# Patient Record
Sex: Male | Born: 1980
Health system: Southern US, Community
[De-identification: ages and names within clinical notes are randomized; demographics above are authoritative.]

## PROBLEM LIST (undated history)

## (undated) ENCOUNTER — Emergency Department (HOSPITAL_BASED_OUTPATIENT_CLINIC_OR_DEPARTMENT_OTHER): Admission: EM | Payer: Commercial Managed Care - HMO | Source: Home / Self Care

## (undated) DIAGNOSIS — J309 Allergic rhinitis, unspecified: Secondary | ICD-10-CM

## (undated) HISTORY — DX: Allergic rhinitis, unspecified: J30.9

---

## 2012-07-04 ENCOUNTER — Ambulatory Visit: Payer: Medicaid Other | Attending: Family Medicine | Admitting: Family Medicine

## 2012-07-04 VITALS — BP 126/86 | HR 62 | Temp 97.8°F | Resp 18 | Ht 68.0 in | Wt 175.0 lb

## 2012-07-04 DIAGNOSIS — R0683 Snoring: Secondary | ICD-10-CM

## 2012-07-04 DIAGNOSIS — R0609 Other forms of dyspnea: Secondary | ICD-10-CM

## 2012-07-04 DIAGNOSIS — J3489 Other specified disorders of nose and nasal sinuses: Secondary | ICD-10-CM

## 2012-07-04 DIAGNOSIS — J309 Allergic rhinitis, unspecified: Secondary | ICD-10-CM | POA: Insufficient documentation

## 2012-07-04 HISTORY — DX: Allergic rhinitis, unspecified: J30.9

## 2012-07-04 MED ORDER — LORATADINE 10 MG PO TABS: 10.0000 mg | ORAL_TABLET | Freq: Every day | ORAL | Status: DC

## 2012-07-04 MED ORDER — FLUTICASONE PROPIONATE 50 MCG/ACT NA SUSP: 2.0000 | Freq: Every day | NASAL | Status: DC

## 2012-07-04 NOTE — Progress Notes (Signed)
Patient ID: Derek Luna, male   DOB: 1980/12/17, 32 y.o.   MRN: [id number] CC: night time nasal congestion Translator used to speak with patient HPI: Pt is a new patient to the clinic and reports that he has a history of nighttime nasal congestion that is bothersome especially at night.  He says that he has been snoring and mouth breathing at night because of his nasal congestion.  He has occasional sneezing and watery eyes.  He reports that he otherwise has been healthy.   No Known Allergies Past Medical History  Diagnosis Date  . Allergic rhinitis 07/04/2012   No current outpatient prescriptions on file prior to visit.   No current facility-administered medications on file prior to visit.   History reviewed. No pertinent family history. History   Social History  . Marital Status: Married    Spouse Name: N/A    Number of Children: N/A  . Years of Education: N/A   Occupational History  . Not on file.   Social History Main Topics  . Smoking status: Never Smoker   . Smokeless tobacco: Not on file  . Alcohol Use: No  . Drug Use: No  . Sexually Active: Yes   Other Topics Concern  . Not on file   Social History Narrative  . No narrative on file    Review of Systems  Constitutional: Negative for fever, chills, diaphoresis, activity change, appetite change and fatigue.  HENT: Negative for ear pain, nosebleeds, positive nasal congestion, facial swelling, rhinorrhea, neck pain, neck stiffness and ear discharge.   Eyes: Negative for pain, discharge, redness, positive for occasional itching and no visual disturbance.  Respiratory: Negative for cough, choking, chest tightness, shortness of breath, wheezing and stridor.  Cardiovascular: Negative for chest pain, palpitations and leg swelling.  Gastrointestinal: Negative for abdominal distention.  Genitourinary: Negative for dysuria, urgency, frequency, hematuria, flank pain, decreased urine volume, difficulty urinating and  dyspareunia.  Musculoskeletal: Negative for back pain, joint swelling, arthralgias and gait problem.  Neurological: Negative for dizziness, tremors, seizures, syncope, facial asymmetry, speech difficulty, weakness, light-headedness, numbness and headaches.  Hematological: Negative for adenopathy. Does not bruise/bleed easily.  Psychiatric/Behavioral: Negative for hallucinations, behavioral problems, confusion, dysphoric mood, decreased concentration and agitation.    Objective:   Filed Vitals:   07/04/12 1028  BP: 126/86  Pulse: 62  Temp: 97.8 F (36.6 C)  Resp: 18    Physical Exam  Constitutional: Appears well-developed and well-nourished. No distress.  HENT: Normocephalic. External right and left ear normal. Oropharynx is clear and moist. nares reveals swollen nasal turbinates Eyes: Conjunctivae and EOM are normal. PERRLA, no scleral icterus.  Neck: Normal ROM. Neck supple. No JVD. No tracheal deviation. No thyromegaly.  CVS: RRR, S1/S2 +, no murmurs, no gallops, no carotid bruit.  Pulmonary: Effort and breath sounds normal, no stridor, rhonchi, wheezes, rales.  Abdominal: Soft. BS +,  no distension, tenderness, rebound or guarding.  Musculoskeletal: Normal range of motion. No edema and no tenderness.  Lymphadenopathy: No lymphadenopathy noted, cervical, inguinal. Neuro: Alert. Normal reflexes, muscle tone coordination. No cranial nerve deficit. Skin: Skin is warm and dry. No rash noted. Not diaphoretic. No erythema. No pallor.  Psychiatric: Normal mood and affect. Behavior, judgment, thought content normal.   No results found for this basename: WBC, HGB, HCT, MCV, PLT   No results found for this basename: CREATININE, BUN, NA, K, CL, CO2    No results found for this basename: HGBA1C     Assessment:   Patient  Active Problem List   Diagnosis Date Noted  . Allergic rhinitis 07/04/2012          Plan:  flonase NS take 2 spray per nostril once daily claritin 10 mg po daily   RTC if this doesn't work well to clear up symptoms  The patient was given clear instructions to go to ER or return to medical center if symptoms don't improve, worsen or new problems develop.  The patient verbalized understanding.  The patient was told to call to get lab results if they haven't heard anything in the next week.    Rodney Langton, MD, CDE, FAAFP [facility] [place], [place]

## 2012-07-04 NOTE — Patient Instructions (Signed)
Allergic Rhinitis  Allergic rhinitis is when the mucous membranes in the nose respond to allergens. Allergens are particles in the air that cause your body to have an allergic reaction. This causes you to release allergic antibodies. Through a chain of events, these eventually cause you to release histamine into the blood stream (hence the use of antihistamines). Although meant to be protective to the body, it is this release that causes your discomfort, such as frequent sneezing, congestion and an itchy runny nose.    CAUSES    The pollen allergens may come from grasses, trees, and weeds. This is seasonal allergic rhinitis, or "hay fever." Other allergens cause year-round allergic rhinitis (perennial allergic rhinitis) such as house dust mite allergen, pet dander and mold spores.    SYMPTOMS     Nasal stuffiness (congestion).   Runny, itchy nose with sneezing and tearing of the eyes.   There is often an itching of the mouth, eyes and ears.  It cannot be cured, but it can be controlled with medications.  DIAGNOSIS    If you are unable to determine the offending allergen, skin or blood testing may find it.  TREATMENT     Avoid the allergen.   Medications and allergy shots (immunotherapy) can help.   Hay fever may often be treated with antihistamines in pill or nasal spray forms. Antihistamines block the effects of histamine. There are over-the-counter medicines that may help with nasal congestion and swelling around the eyes. Check with your caregiver before taking or giving this medicine.  If the treatment above does not work, there are many new medications your caregiver can prescribe. Stronger medications may be used if initial measures are ineffective. Desensitizing injections can be used if medications and avoidance fails. Desensitization is when a patient is given ongoing shots until the body becomes less sensitive to the allergen. Make sure you follow up with your caregiver if problems continue.   SEEK MEDICAL CARE IF:     You develop fever (more than 100.5 F (38.1 C).   You develop a cough that does not stop easily (persistent).   You have shortness of breath.   You start wheezing.   Symptoms interfere with normal daily activities.  Document Released: 10/31/2000 Document Revised: 04/30/2011 Document Reviewed: 05/12/2008  ExitCare Patient Information 2013 [employer].

## 2012-07-04 NOTE — Progress Notes (Signed)
Patient states him and his wife have trouble Breathing through their nose at night

## 2012-07-05 ENCOUNTER — Encounter: Payer: Self-pay | Admitting: Family Medicine

## 2012-07-05 DIAGNOSIS — R0981 Nasal congestion: Secondary | ICD-10-CM | POA: Insufficient documentation

## 2012-07-05 DIAGNOSIS — R0683 Snoring: Secondary | ICD-10-CM | POA: Insufficient documentation

## 2012-08-26 ENCOUNTER — Encounter (HOSPITAL_COMMUNITY): Payer: Self-pay | Admitting: Emergency Medicine

## 2012-08-26 ENCOUNTER — Emergency Department (HOSPITAL_COMMUNITY)
Admission: EM | Admit: 2012-08-26 | Discharge: 2012-08-26 | Disposition: A | Discharge status: Home / Self Care | Payer: Medicaid Other | Source: Home / Self Care

## 2012-08-26 DIAGNOSIS — K5900 Constipation, unspecified: Secondary | ICD-10-CM

## 2012-08-26 DIAGNOSIS — R1033 Periumbilical pain: Secondary | ICD-10-CM

## 2012-08-26 MED ORDER — POLYETHYLENE GLYCOL 3350 17 GM/SCOOP PO POWD: 17.0000 g | Freq: Every day | ORAL | Status: DC

## 2012-08-26 NOTE — ED Notes (Signed)
C/o stomach pain X 2 days, Pt states pain is constant and stomach feels tight.  Pt denies N/V and fever.  Pt states he has had some cramping.  Pt is alert and in no acute distress.  Leilani Able CMA Student

## 2012-08-26 NOTE — ED Provider Notes (Signed)
   History    CSN: [id number] Arrival date & time 08/26/12  1515  None    Chief Complaint  Patient presents with  . Abdominal Pain   (Consider location/radiation/quality/duration/timing/severity/associated sxs/prior Treatment) HPI Comments: 32 year old [place] man complaining of 2 and a half days of intermittent periumbilical pain. States the pain is mild and rarely moderate. He denies trauma or taking the medicine. He does state that he has a recent change in diet and that is a practicing Rhmadan in which his diet/food intake is drastically reduced. He states he has had normal bowel movements the last one was last night.  Past Medical History  Diagnosis Date  . Allergic rhinitis 07/04/2012   History reviewed. No pertinent past surgical history. No family history on file. History  Substance Use Topics  . Smoking status: Never Smoker   . Smokeless tobacco: Not on file  . Alcohol Use: No    Review of Systems  Constitutional: Negative.   HENT: Negative.   Respiratory: Negative.   Gastrointestinal: Positive for abdominal pain. Negative for nausea, vomiting, diarrhea, blood in stool, abdominal distention, anal bleeding and rectal pain.  Genitourinary: Negative.   Musculoskeletal: Negative.   Skin: Negative.   Neurological: Negative.   Hematological: Negative.     Allergies  Review of patient's allergies indicates no known allergies.  Home Medications   Current Outpatient Rx  Name  Route  Sig  Dispense  Refill  . fluticasone (FLONASE) 50 MCG/ACT nasal spray   Nasal   Place 2 sprays into the nose daily.   16 g   6   . loratadine (CLARITIN) 10 MG tablet   Oral   Take 1 tablet (10 mg total) by mouth daily.   30 tablet   11   . polyethylene glycol powder (GLYCOLAX/MIRALAX) powder   Oral   Take 17 g by mouth daily.   255 g   0    BP 130/88  Pulse 71  Temp(Src) 98.2 F (36.8 C) (Oral)  Resp 17  SpO2 100% Physical Exam  Nursing note and vitals  reviewed. Constitutional: He is oriented to person, place, and time. He appears well-developed and well-nourished. No distress.  Eyes: Conjunctivae and EOM are normal.  Neck: Normal range of motion.  Cardiovascular: Normal rate, regular rhythm and normal heart sounds.   Pulmonary/Chest: Breath sounds normal. No respiratory distress. He has no wheezes.  Abdominal: Soft. Bowel sounds are normal. He exhibits no distension. There is no rebound and no guarding.  Minor tenderness in the. Buccal abdomen. No tenderness in the right or left upper or lower quadrants. Data to percusses dull over the areas of the large colon and there are no areas that percuss tympanic.  Musculoskeletal: He exhibits no edema.  Neurological: He is alert and oriented to person, place, and time. He exhibits normal muscle tone.  Skin: Skin is warm and dry.  Psychiatric: He has a normal mood and affect.    ED Course  Procedures (including critical care time) Labs Reviewed - No data to display No results found. 1. Periumbilical pain   2. Constipation     MDM  I suspect, since the patient has had a change in diet during [place] he has developed constipation. Abdominal exam benign . Otherwise asymptomatic and appears healthy.  Miralax as directed with plenty of water.   Hayden Rasmussen, NP 08/26/12 1553  Hayden Rasmussen, NP 08/26/12 1556

## 2012-08-27 NOTE — ED Provider Notes (Signed)
Medical screening examination/treatment/procedure(s) were performed by resident physician or non-physician practitioner and as supervising physician I was immediately available for consultation/collaboration.   KINDL,JAMES DOUGLAS MD.   James D Kindl, MD 08/27/12 2054 

## 2014-02-07 ENCOUNTER — Emergency Department (HOSPITAL_COMMUNITY)
Admission: EM | Admit: 2014-02-07 | Discharge: 2014-02-07 | Disposition: A | Discharge status: Home / Self Care | Payer: No Typology Code available for payment source | Attending: Emergency Medicine | Admitting: Emergency Medicine

## 2014-02-07 ENCOUNTER — Encounter (HOSPITAL_COMMUNITY): Payer: Self-pay | Admitting: Emergency Medicine

## 2014-02-07 DIAGNOSIS — S0990XA Unspecified injury of head, initial encounter: Secondary | ICD-10-CM | POA: Diagnosis not present

## 2014-02-07 DIAGNOSIS — Z79899 Other long term (current) drug therapy: Secondary | ICD-10-CM | POA: Diagnosis not present

## 2014-02-07 DIAGNOSIS — Z7951 Long term (current) use of inhaled steroids: Secondary | ICD-10-CM | POA: Diagnosis not present

## 2014-02-07 DIAGNOSIS — Y9389 Activity, other specified: Secondary | ICD-10-CM | POA: Diagnosis not present

## 2014-02-07 DIAGNOSIS — Y998 Other external cause status: Secondary | ICD-10-CM | POA: Diagnosis not present

## 2014-02-07 DIAGNOSIS — S24109A Unspecified injury at unspecified level of thoracic spinal cord, initial encounter: Secondary | ICD-10-CM | POA: Diagnosis not present

## 2014-02-07 DIAGNOSIS — R51 Headache: Secondary | ICD-10-CM

## 2014-02-07 DIAGNOSIS — Y92410 Unspecified street and highway as the place of occurrence of the external cause: Secondary | ICD-10-CM | POA: Insufficient documentation

## 2014-02-07 DIAGNOSIS — Z8709 Personal history of other diseases of the respiratory system: Secondary | ICD-10-CM | POA: Insufficient documentation

## 2014-02-07 DIAGNOSIS — R519 Headache, unspecified: Secondary | ICD-10-CM

## 2014-02-07 DIAGNOSIS — S8992XA Unspecified injury of left lower leg, initial encounter: Secondary | ICD-10-CM | POA: Diagnosis not present

## 2014-02-07 MED ORDER — IBUPROFEN 800 MG PO TABS: 800.0000 mg | ORAL_TABLET | Freq: Three times a day (TID) | ORAL | Status: DC | PRN

## 2014-02-07 MED ORDER — CYCLOBENZAPRINE HCL 10 MG PO TABS: 10.0000 mg | ORAL_TABLET | Freq: Three times a day (TID) | ORAL | Status: DC | PRN

## 2014-02-07 MED ORDER — IBUPROFEN 800 MG PO TABS
800.0000 mg | ORAL_TABLET | Freq: Once | ORAL | Status: AC
  Administered 2014-02-07: 800 mg via ORAL
  Filled 2014-02-07: qty 1
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
## 2014-02-07 NOTE — Discharge Instructions (Signed)
Read the information below.  Use the prescribed medication as directed.  Please discuss all new medications with your pharmacist.  You may return to the Emergency Department at any time for worsening condition or any new symptoms that concern you.    ° °You have had a head injury which does not appear to require admission at this time. A concussion is a state of changed mental ability from trauma. °SEEK IMMEDIATE MEDICAL ATTENTION IF: °There is confusion or drowsiness (although children frequently become drowsy after injury).  °You cannot awaken the injured person.  °There is nausea (feeling sick to your stomach) or continued, forceful vomiting.  °You notice dizziness or unsteadiness which is getting worse, or inability to walk.  °You have convulsions or unconsciousness.  °You experience severe, persistent headaches not relieved by Tylenol?. (Do not take aspirin as this impairs clotting abilities). Take other pain medications only as directed.  °You cannot use arms or legs normally.  °There are changes in pupil sizes. (This is the black center in the colored part of the eye)  °There is clear or bloody discharge from the nose or ears.  °Change in speech, vision, swallowing, or understanding.  °Localized weakness, numbness, tingling, or change in bowel or bladder control.  °
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
## 2015-05-27 ENCOUNTER — Encounter (HOSPITAL_COMMUNITY): Payer: Self-pay | Admitting: Emergency Medicine

## 2015-05-27 ENCOUNTER — Ambulatory Visit (HOSPITAL_COMMUNITY)
Admission: EM | Admit: 2015-05-27 | Discharge: 2015-05-27 | Disposition: A | Discharge status: Home / Self Care | Payer: BLUE CROSS/BLUE SHIELD | Attending: Emergency Medicine | Admitting: Emergency Medicine

## 2015-05-27 DIAGNOSIS — R059 Cough, unspecified: Secondary | ICD-10-CM

## 2015-05-27 DIAGNOSIS — R05 Cough: Secondary | ICD-10-CM | POA: Diagnosis not present

## 2015-05-27 MED ORDER — HYDROCOD POLST-CPM POLST ER 10-8 MG/5ML PO SUER: 5.0000 mL | Freq: Two times a day (BID) | ORAL | Status: DC | PRN

## 2015-05-27 NOTE — Discharge Instructions (Signed)
[facility]: [address] [place] [id number]  [phone]  [facility]: [address] [place] [id number]   [phone]  [facility]: [address] [place] [id number]  [phone]  [facility] : [address] [place] [id number] [phone]  [facility]: [address] [place] [phone]  [facility]: [address] [place] [id number] [phone]  Dr. Oneal GroutMahima Pandey [address] [facility] [place] [id number]  [phone]  Dr. Jackie PlumGeorge Osei-Bonsu, [facility]. [address] [place] [address]  [phone]  [facility] [address], [address], [place] [phone]

## 2015-05-27 NOTE — ED Provider Notes (Signed)
HPI  SUBJECTIVE:  Derek Luna is a 35 y.o. male who presents with a nonproductive cough for the past week. States he cannot sleep at night secondary to the cough. He reports nasal congestion only at night. He has tried Robitussin Cough and cold with some improvement. There are no aggravating symptoms. He denies sore throat, runny nose, itchy, watery eyes, sneezing, fevers, chills, wheezing, chest pain, shortness of breath, postnasal drip, bodyaches, headaches, other flulike symptoms. He does report some burning in his chest and water brash. He is not on any antihypertensive medications. No antipyretic in the past 4-6 hours. Past medical history of GERD. No history of hypertension, diabetes, asthma, emphysema, COPD, smoking. PMD: None.    Past Medical History  Diagnosis Date  . Allergic rhinitis 07/04/2012    History reviewed. No pertinent past surgical history.  No family history on file.  Social History  Substance Use Topics  . Smoking status: Never Smoker   . Smokeless tobacco: None  . Alcohol Use: No    No current facility-administered medications for this encounter.  Current outpatient prescriptions:  .  guaiFENesin (ROBITUSSIN) 100 MG/5ML liquid, Take 200 mg by mouth 3 (three) times daily as needed for cough., Disp: , Rfl:  .  chlorpheniramine-HYDROcodone (TUSSIONEX PENNKINETIC ER) 10-8 MG/5ML SUER, Take 5 mLs by mouth every 12 (twelve) hours as needed for cough., Disp: 120 mL, Rfl: 0 .  cyclobenzaprine (FLEXERIL) 10 MG tablet, Take 1 tablet (10 mg total) by mouth 3 (three) times daily as needed for muscle spasms (or pain)., Disp: 15 tablet, Rfl: 0 .  fluticasone (FLONASE) 50 MCG/ACT nasal spray, Place 2 sprays into the nose daily., Disp: 16 g, Rfl: 6 .  ibuprofen (ADVIL,MOTRIN) 800 MG tablet, Take 1 tablet (800 mg total) by mouth every 8 (eight) hours as needed for mild pain or moderate pain., Disp: 15 tablet, Rfl: 0 .  loratadine (CLARITIN) 10 MG tablet, Take 1 tablet (10 mg  total) by mouth daily., Disp: 30 tablet, Rfl: 11 .  polyethylene glycol powder (GLYCOLAX/MIRALAX) powder, Take 17 g by mouth daily., Disp: 255 g, Rfl: 0  No Known Allergies   ROS  As noted in HPI.   Physical Exam  BP 142/85 mmHg  Pulse 66  Temp(Src) 99 F (37.2 C) (Oral)  Resp 16  Ht 5\' 9"  (1.753 m)  Wt 180 lb (81.647 kg)  BMI 26.57 kg/m2  SpO2 99%  Constitutional: Well developed, well nourished, no acute distress Eyes:  EOMI, conjunctiva normal bilaterally HENT: Normocephalic, atraumatic,mucus membranes moist. No nasal congestion. Normal naris. Normal oropharynx. Respiratory: Normal inspiratory effort good air movement. Lungs clear bilaterally Cardiovascular: Normal rate GI: nondistended skin: No rash, skin intact Musculoskeletal: no deformities Neurologic: Alert & oriented x 3, no focal neuro deficits Psychiatric: Speech and behavior appropriate   ED Course   Medications - No data to display  No orders of the defined types were placed in this encounter.    No results found for this or any previous visit (from the past 24 hour(s)). No results found.  ED Clinical Impression  Cough   ED Assessment/Plan  Feel the cough is most likely from acid reflux given patient's reported burning in his chest, water brash and history of GERD. There is no evidence of pneumonia at this time. He denies shortness of breath or wheezing, so do not think bronchodilators would be helpful at this time. Will start Pepcid, Protonix, also some Cheratussin for comfort while this starts to work, primary care referral.  Discussed signs and symptoms that should prompt return to the department. Patient agrees with plan.  Domenick Gong, MD 05/27/15 2152

## 2015-05-27 NOTE — ED Notes (Signed)
PT reports a cough for one week. PT reports small amounts of green phlegm. PT denies sore throat, sinus pressure, and fever. PT has tried Robitussin and reports it has only helped slightly.

## 2015-05-27 NOTE — ED Notes (Signed)
PT was discharged by Dr. Chaney MallingMortenson.

## 2015-05-27 NOTE — ED Notes (Signed)
PT reports pain when coughing. PT reports cough keeps him up at night.

## 2016-12-17 ENCOUNTER — Encounter (HOSPITAL_COMMUNITY): Payer: Self-pay | Admitting: Emergency Medicine

## 2016-12-17 ENCOUNTER — Ambulatory Visit (HOSPITAL_COMMUNITY)
Admission: EM | Admit: 2016-12-17 | Discharge: 2016-12-17 | Disposition: A | Discharge status: Home / Self Care | Payer: BLUE CROSS/BLUE SHIELD | Attending: Emergency Medicine | Admitting: Emergency Medicine

## 2016-12-17 DIAGNOSIS — R3129 Other microscopic hematuria: Secondary | ICD-10-CM

## 2016-12-17 DIAGNOSIS — J029 Acute pharyngitis, unspecified: Secondary | ICD-10-CM | POA: Diagnosis not present

## 2016-12-17 DIAGNOSIS — R319 Hematuria, unspecified: Secondary | ICD-10-CM | POA: Insufficient documentation

## 2016-12-17 DIAGNOSIS — R102 Pelvic and perineal pain: Secondary | ICD-10-CM | POA: Insufficient documentation

## 2016-12-17 DIAGNOSIS — R300 Dysuria: Secondary | ICD-10-CM | POA: Diagnosis not present

## 2016-12-17 LAB — POCT URINALYSIS DIP (DEVICE)
GLUCOSE, UA: NEGATIVE mg/dL
KETONES UR: NEGATIVE mg/dL
Leukocytes, UA: NEGATIVE
Nitrite: NEGATIVE
PH: 5.5 (ref 5.0–8.0)
PROTEIN: 30 mg/dL — AB
Urobilinogen, UA: 0.2 mg/dL (ref 0.0–1.0)

## 2016-12-17 LAB — POCT RAPID STREP A: STREPTOCOCCUS, GROUP A SCREEN (DIRECT): NEGATIVE

## 2016-12-17 MED ORDER — PHENAZOPYRIDINE HCL 200 MG PO TABS: 200.0000 mg | ORAL_TABLET | Freq: Three times a day (TID) | ORAL | 0 refills | Status: DC | PRN

## 2016-12-17 MED ORDER — IBUPROFEN 600 MG PO TABS: 600.0000 mg | ORAL_TABLET | Freq: Four times a day (QID) | ORAL | 0 refills | Status: DC | PRN

## 2016-12-17 NOTE — ED Triage Notes (Signed)
Pt sts sore throat and lower abd pain; pt sts "bladder hurts"

## 2016-12-17 NOTE — Discharge Instructions (Signed)
your rapid strep was negative today, so we have sent off a throat culture.  We have also sent off for urine culture.  We will contact you and call in the appropriate antibiotics if your throat or urine cultures come back positive for an infection requiring antibiotic treatment.  Give us a working phone number.   1 gram of Tylenol and 600 mg ibuprofen together 3-4 times a day as needed for pain.  Make sure you drink plenty of extra fluids.  Some people find salt water gargles and  Traditional Medicinal's "Throat Coat" tea helpful. Take 5 mL of liquid Benadryl and 5 mL of Maalox. Mix it together, and then hold it in your mouth for as long as you can and then swallow. You may do this 4 times a day.    Go to www.goodrx.com to look up your medications. This will give you a list of where you can find your prescriptions at the most affordable prices. Or ask the pharmacist what the cash price is, or if they have any other discount programs available to help make your medication more affordable. This can be less expensive than what you would pay with insurance.

## 2016-12-17 NOTE — ED Provider Notes (Signed)
HPI  SUBJECTIVE:  Derek Luna is a 36 y.o. male who presents with 2 complaints.  First he reports sore throat, cervical lymphadenopathy starting yesterday.  No aggravating or alleviating factors.  He has not tried anything for this.  He denies fevers, drooling, trismus, voice changes, body aches, headaches, abdominal pain, rash.  No sensation of throat swelling shut, difficulty breathing.  No nasal congestion, rhinorrhea, cough, allergy or GERD symptoms.  No contacts with mono or strep.  No antipyretic in the past 6-8 hours  Second, he reports midline pelvic pain/soreness states that his "bladder hurts" starting today.  He has not tried anything for this, no alleviating factors.  Symptoms are worse with urinating.  He denies dysuria, urinary urgency, frequency, cloudy odorous urine, hematuria.  No fevers.  No back, perineal pain.  No penile rash, discharge, testicular pain or swelling.  He is in a long-term monogamous relationship with his wife who is asymptomatic.  STDs are not a concern today.  He has had similar symptoms before, states that they were from a UTI.  Past medical history of hypertension, UTI, no history of pyelonephritis, nephrolithiasis, urethritis, prostatitis, gonorrhea, chlamydia, HIV, HSV, trichomonas, syphilis.  No history of diabetes.  No history of mono, recurrent strep.    Past Medical History:  Diagnosis Date  . Allergic rhinitis 07/04/2012    History reviewed. No pertinent surgical history.  History reviewed. No pertinent family history.  Social History  Substance Use Topics  . Smoking status: Never Smoker  . Smokeless tobacco: Not on file  . Alcohol use No    No current facility-administered medications for this encounter.   Current Outpatient Prescriptions:  .  fluticasone (FLONASE) 50 MCG/ACT nasal spray, Place 2 sprays into the nose daily., Disp: 16 g, Rfl: 6 .  ibuprofen (ADVIL,MOTRIN) 600 MG tablet, Take 1 tablet (600 mg total) by mouth every 6 (six)  hours as needed., Disp: 30 tablet, Rfl: 0 .  loratadine (CLARITIN) 10 MG tablet, Take 1 tablet (10 mg total) by mouth daily., Disp: 30 tablet, Rfl: 11 .  phenazopyridine (PYRIDIUM) 200 MG tablet, Take 1 tablet (200 mg total) by mouth 3 (three) times daily as needed for pain., Disp: 6 tablet, Rfl: 0 .  polyethylene glycol powder (GLYCOLAX/MIRALAX) powder, Take 17 g by mouth daily., Disp: 255 g, Rfl: 0  No Known Allergies   ROS  As noted in HPI.   Physical Exam  BP 123/75 (BP Location: Right Arm)   Pulse 67   Temp 97.9 F (36.6 C) (Oral)   Resp 18   SpO2 99%   Constitutional: Well developed, well nourished, no acute distress Eyes:  EOMI, conjunctiva normal bilaterally HENT: Normocephalic, atraumatic,mucus membranes moist.  No nasal congestion.  Normal tonsils, uvula midline.  Erythematous oropharynx. Neck: Positive shotty cervical lymphadenopathy Respiratory: Normal inspiratory effort Cardiovascular: Normal rate regular rhythm no murmurs rubs or gallops. GI: nondistended.  No splenomegaly.  Positive suprapubic and flank tenderness worse on the right than the left  Back: No CVAT  GU: Normal circumcised male, testes descended bilaterally.  No testicular, epididymal tenderness.  No penile rash, discharge.  Patient declined chaperone.   Rectal: Normal rectal tone, prostate normal, no tenderness or bogginess. Lymph: No inguinal lymphadenopathy Skin: No rash, skin intact Musculoskeletal: no deformities Neurologic: Alert & oriented x 3, no focal neuro deficits Psychiatric: Speech and behavior appropriate   ED Course   Medications - No data to display  Orders Placed This Encounter  Procedures  . Culture, group  A strep    Standing Status:   Standing    Number of Occurrences:   1  . Urine culture    Standing Status:   Standing    Number of Occurrences:   1  . POCT urinalysis dip (device)    Standing Status:   Standing    Number of Occurrences:   1  . POCT rapid strep A [facility])    Standing Status:   Standing    Number of Occurrences:   1    Results for orders placed or performed during the hospital encounter of 12/17/16 (from the past 24 hour(s))  POCT urinalysis dip (device)     Status: Abnormal   Collection Time: 12/17/16  5:26 PM  Result Value Ref Range   Glucose, UA NEGATIVE NEGATIVE mg/dL   Bilirubin Urine SMALL (A) NEGATIVE   Ketones, ur NEGATIVE NEGATIVE mg/dL   Specific Gravity, Urine >=1.030 1.005 - 1.030   Hgb urine dipstick LARGE (A) NEGATIVE   pH 5.5 5.0 - 8.0   Protein, ur 30 (A) NEGATIVE mg/dL   Urobilinogen, UA 0.2 0.0 - 1.0 mg/dL   Nitrite NEGATIVE NEGATIVE   Leukocytes, UA NEGATIVE NEGATIVE  POCT rapid strep A [facility])     Status: None   Collection Time: 12/17/16  6:18 PM  Result Value Ref Range   Streptococcus, Group A Screen (Direct) NEGATIVE NEGATIVE   No results found.  ED Clinical Impression  Pharyngitis, unspecified etiology  Dysuria  Other microscopic hematuria   ED Assessment/Plan  Rapid strep negative.  Sending throat culture off.  Patient gave [place] a clean urine sample, so I am unable to test his urine for gonorrhea or chlamydia.  We talked about doing a swab for gonorrhea of the throat, but since the patient is in a monogamous long-term relationship with his wife, we have decided to wait and see because he is low risk.  His urine is significant for large hematuria, proteinuria, it is concentrated.  There is no nitrate or esterase, but will send urine off for culture to confirm absence of a UTI.  Feel that his symptoms are coming from the hematuria.  It does not appear to be prostatitis, epididymitis.  Urethritis in the differential, but again, feel that patient is low risk for STDs.  He has no back pain, CVA tenderness, history of pyelonephritis or nephrolithiasis although given the hematuria nephrolithiasis is in the differential.  He will follow-up with his primary care physician in 1 week for recheck of  his urine and possible referral to urology or nephrology if he has persistent hematuria, advised to increase fluid intake.  We will send him home with Pyridium for the dysuria, ibuprofen 600 mg of 1 g of Tylenol 3-4 times a day, Benadryl/Maalox mixture for the sore throat.  Discussed labs, MDM, plan and followup with patient. Discussed sn/sx that should prompt return to the ED. patient agrees with plan.   Meds ordered this encounter  Medications  . ibuprofen (ADVIL,MOTRIN) 600 MG tablet    Sig: Take 1 tablet (600 mg total) by mouth every 6 (six) hours as needed.    Dispense:  30 tablet    Refill:  0  . phenazopyridine (PYRIDIUM) 200 MG tablet    Sig: Take 1 tablet (200 mg total) by mouth 3 (three) times daily as needed for pain.    Dispense:  6 tablet    Refill:  0    *This clinic note was created  using Scientist, clinical (histocompatibility and immunogenetics)Dragon dictation software. Therefore, there may be occasional mistakes despite careful proofreading.   ?    Domenick GongMortenson, Rahmah Mccamy, MD 12/18/16 [phone]

## 2016-12-19 LAB — URINE CULTURE

## 2016-12-20 LAB — CULTURE, GROUP A STREP (THRC)

## 2016-12-28 ENCOUNTER — Ambulatory Visit (HOSPITAL_COMMUNITY)
Admission: EM | Admit: 2016-12-28 | Discharge: 2016-12-28 | Disposition: A | Discharge status: Home / Self Care | Payer: BLUE CROSS/BLUE SHIELD | Attending: Emergency Medicine | Admitting: Emergency Medicine

## 2016-12-28 ENCOUNTER — Other Ambulatory Visit: Payer: Self-pay

## 2016-12-28 ENCOUNTER — Encounter (HOSPITAL_COMMUNITY): Payer: Self-pay | Admitting: Emergency Medicine

## 2016-12-28 DIAGNOSIS — J209 Acute bronchitis, unspecified: Secondary | ICD-10-CM | POA: Diagnosis not present

## 2016-12-28 DIAGNOSIS — R059 Cough, unspecified: Secondary | ICD-10-CM

## 2016-12-28 DIAGNOSIS — R05 Cough: Secondary | ICD-10-CM

## 2016-12-28 MED ORDER — METHYLPREDNISOLONE SODIUM SUCC 125 MG IJ SOLR
125.0000 mg | Freq: Once | INTRAMUSCULAR | Status: AC
  Administered 2016-12-28: 125 mg via INTRAMUSCULAR

## 2016-12-28 MED ORDER — METHYLPREDNISOLONE SODIUM SUCC 125 MG IJ SOLR
INTRAMUSCULAR | Status: AC
  Filled 2016-12-28: qty 2

## 2016-12-28 MED ORDER — METHYLPREDNISOLONE SODIUM SUCC 125 MG IJ SOLR: 125.0000 mg | Freq: Once | INTRAMUSCULAR | Status: DC

## 2016-12-28 MED ORDER — DEXTROMETHORPHAN HBR 15 MG/5ML PO SYRP: 10.0000 mL | ORAL_SOLUTION | Freq: Four times a day (QID) | ORAL | 0 refills | Status: DC | PRN

## 2016-12-28 NOTE — Discharge Instructions (Signed)
Use the cough meds as needed It does not seem that you have an infection needing antibiotic treatment The cough can linger for a few weeks  We will give an injection of steroids that will take a few days to help  May use an humidifier at night to help  Take a Claritin or zyrtec daily

## 2016-12-28 NOTE — ED Provider Notes (Addendum)
[facility]    CSN: [id number] Arrival date & time: 12/28/16  1601     History   Chief Complaint Chief Complaint  Patient presents with  . Cough    HPI Derek Luna is a 36 y.o. male.   HPI  Past Medical History:  Diagnosis Date  . Allergic rhinitis 07/04/2012    Patient Active Problem List   Diagnosis Date Noted  . Nasal congestion with rhinorrhea 07/05/2012  . Snoring 07/05/2012  . Allergic rhinitis 07/04/2012    History reviewed. No pertinent surgical history.     Home Medications    Prior to Admission medications   Medication Sig Start Date End Date Taking? Authorizing Provider  dextromethorphan 15 MG/5ML syrup Take 10 mLs (30 mg total) 4 (four) times daily as needed by mouth for cough. 12/28/16   Coralyn MarkMitchell, Kenika Sahm L, NP  fluticasone (FLONASE) 50 MCG/ACT nasal spray Place 2 sprays into the nose daily. 07/04/12   Johnson, Clanford L, MD  ibuprofen (ADVIL,MOTRIN) 600 MG tablet Take 1 tablet (600 mg total) by mouth every 6 (six) hours as needed. 12/17/16   Domenick GongMortenson, Ashley, MD  loratadine (CLARITIN) 10 MG tablet Take 1 tablet (10 mg total) by mouth daily. 07/04/12   Johnson, Clanford L, MD  phenazopyridine (PYRIDIUM) 200 MG tablet Take 1 tablet (200 mg total) by mouth 3 (three) times daily as needed for pain. 12/17/16   Domenick GongMortenson, Ashley, MD  polyethylene glycol powder (GLYCOLAX/MIRALAX) powder Take 17 g by mouth daily. 08/26/12   Hayden RasmussenMabe, David, NP    Family History No family history on file.  Social History Social History   Tobacco Use  . Smoking status: Never Smoker  Substance Use Topics  . Alcohol use: No  . Drug use: No     Allergies   Patient has no known allergies.   Review of Systems Review of Systems  Constitutional: Negative.   HENT: Positive for sneezing.   Eyes: Negative.   Respiratory: Positive for cough.   Cardiovascular: Negative.   Skin: Negative.   Neurological: Negative.      Physical Exam Triage Vital Signs ED  Triage Vitals [12/28/16 1643]  Enc Vitals Group     BP 126/84     Pulse Rate 77     Resp 16     Temp 98.4 F (36.9 C)     Temp Source Oral     SpO2 100 %     Weight      Height      Head Circumference      Peak Flow      Pain Score      Pain Loc      Pain Edu?      Excl. in GC?    No data found.  Updated Vital Signs BP 126/84   Pulse 77   Temp 98.4 F (36.9 C) (Oral)   Resp 16   SpO2 100%   Visual Acuity Right Eye Distance:   Left Eye Distance:   Bilateral Distance:    Right Eye Near:   Left Eye Near:    Bilateral Near:     Physical Exam  Constitutional: He appears well-developed.  HENT:  Head: Normocephalic.  Right Ear: External ear normal.  Left Ear: External ear normal.  Nose: Nose normal.  Mouth/Throat: Oropharynx is clear and moist.  Eyes: Pupils are equal, round, and reactive to light.  Neck: Normal range of motion.  Cardiovascular: Normal rate and regular rhythm.  Pulmonary/Chest: Effort normal and  breath sounds normal.  Dry cough non productive   Neurological: He is alert.  Skin: Skin is warm.     UC Treatments / Results  Labs (all labs ordered are listed, but only abnormal results are displayed) Labs Reviewed - No data to display  EKG  EKG Interpretation None       Radiology No results found.  Procedures Procedures (including critical care time)  Medications Ordered in UC Medications  methylPREDNISolone sodium succinate (SOLU-MEDROL) 125 mg/2 mL injection 125 mg (not administered)     Initial Impression / Assessment and Plan / UC Course  I have reviewed the triage vital signs and the nursing notes.  Pertinent labs & imaging results that were available during my care of the patient were reviewed by me and considered in my medical decision making (see chart for details).     Use the cough meds as needed It does not seem that you have an infection needing antibiotic treatment The cough can linger for a few weeks  We will  give an injection of steroids that will take a few days to help  May use an humidifier at night to help  Take a Claritin or zyrtec daily  Reviewed previous chart   Final Clinical Impressions(s) / UC Diagnoses   Final diagnoses:  Cough  Acute bronchitis, unspecified organism    ED Discharge Orders        Ordered    dextromethorphan 15 MG/5ML syrup  4 times daily PRN     12/28/16 1735       Controlled Substance Prescriptions [place] Controlled Substance Registry consulted? Not Applicable   Coralyn MarkMitchell, Sundiata Ferrick L, NP 12/28/16 1738    Coralyn MarkMitchell, Caius Silbernagel L, NP 12/28/16 1739

## 2016-12-28 NOTE — ED Triage Notes (Signed)
Pt c/o cough and sneezing.

## 2017-09-24 ENCOUNTER — Other Ambulatory Visit: Payer: Self-pay

## 2017-09-24 ENCOUNTER — Ambulatory Visit: Payer: BLUE CROSS/BLUE SHIELD | Admitting: Family Medicine

## 2017-09-24 ENCOUNTER — Ambulatory Visit (INDEPENDENT_AMBULATORY_CARE_PROVIDER_SITE_OTHER): Payer: BLUE CROSS/BLUE SHIELD | Admitting: Family Medicine

## 2017-09-24 ENCOUNTER — Encounter: Payer: Self-pay | Admitting: Family Medicine

## 2017-09-24 VITALS — BP 130/88 | HR 82 | Temp 98.4°F | Resp 16 | Ht 68.5 in | Wt 178.8 lb

## 2017-09-24 DIAGNOSIS — Z0000 Encounter for general adult medical examination without abnormal findings: Secondary | ICD-10-CM | POA: Diagnosis not present

## 2017-09-24 DIAGNOSIS — K219 Gastro-esophageal reflux disease without esophagitis: Secondary | ICD-10-CM | POA: Diagnosis not present

## 2017-09-24 DIAGNOSIS — E663 Overweight: Secondary | ICD-10-CM | POA: Diagnosis not present

## 2017-09-24 DIAGNOSIS — E559 Vitamin D deficiency, unspecified: Secondary | ICD-10-CM | POA: Diagnosis not present

## 2017-09-24 LAB — CBC WITH DIFFERENTIAL/PLATELET
Basophils Absolute: 0.1 10*3/uL (ref 0.0–0.1)
Basophils Relative: 1.4 % (ref 0.0–3.0)
Eosinophils Absolute: 0.1 10*3/uL (ref 0.0–0.7)
Eosinophils Relative: 2.3 % (ref 0.0–5.0)
HCT: 46.2 % (ref 39.0–52.0)
HEMOGLOBIN: 15.8 g/dL (ref 13.0–17.0)
Lymphocytes Relative: 43.9 % (ref 12.0–46.0)
Lymphs Abs: 2.1 10*3/uL (ref 0.7–4.0)
MCHC: 34.1 g/dL (ref 30.0–36.0)
MCV: 85.8 fl (ref 78.0–100.0)
MONO ABS: 0.4 10*3/uL (ref 0.1–1.0)
Monocytes Relative: 9.2 % (ref 3.0–12.0)
Neutro Abs: 2 10*3/uL (ref 1.4–7.7)
Neutrophils Relative %: 43.2 % (ref 43.0–77.0)
Platelets: 193 10*3/uL (ref 150.0–400.0)
RBC: 5.38 Mil/uL (ref 4.22–5.81)
RDW: 13.1 % (ref 11.5–15.5)
WBC: 4.7 10*3/uL (ref 4.0–10.5)

## 2017-09-24 LAB — HEPATIC FUNCTION PANEL
ALBUMIN: 4.8 g/dL (ref 3.5–5.2)
ALT: 15 U/L (ref 0–53)
AST: 12 U/L (ref 0–37)
Alkaline Phosphatase: 55 U/L (ref 39–117)
Bilirubin, Direct: 0.3 mg/dL (ref 0.0–0.3)
Total Bilirubin: 1.7 mg/dL — ABNORMAL HIGH (ref 0.2–1.2)
Total Protein: 7.3 g/dL (ref 6.0–8.3)

## 2017-09-24 LAB — BASIC METABOLIC PANEL
BUN: 23 mg/dL (ref 6–23)
CALCIUM: 9.6 mg/dL (ref 8.4–10.5)
CO2: 28 meq/L (ref 19–32)
CREATININE: 0.79 mg/dL (ref 0.40–1.50)
Chloride: 106 mEq/L (ref 96–112)
GFR: 116.78 mL/min (ref >60.00)
GLUCOSE: 89 mg/dL (ref 70–99)
Potassium: 4 mEq/L (ref 3.5–5.1)
Sodium: 141 mEq/L (ref 135–145)

## 2017-09-24 LAB — TSH: TSH: 1.14 u[IU]/mL (ref 0.35–4.50)

## 2017-09-24 LAB — LIPID PANEL
CHOLESTEROL: 156 mg/dL (ref 0–200)
HDL: 45.7 mg/dL (ref >39.00)
LDL Cholesterol: 85 mg/dL (ref 0–99)
NonHDL: 109.89
TRIGLYCERIDES: 123 mg/dL (ref 0.0–149.0)
Total CHOL/HDL Ratio: 3
VLDL: 24.6 mg/dL (ref 0.0–40.0)

## 2017-09-24 LAB — VITAMIN D 25 HYDROXY (VIT D DEFICIENCY, FRACTURES): VITD: 16.28 ng/mL — AB (ref 30.00–100.00)

## 2017-09-24 MED ORDER — OMEPRAZOLE 20 MG PO CPDR: 20.0000 mg | DELAYED_RELEASE_CAPSULE | Freq: Every day | ORAL | 1 refills | Status: DC

## 2017-09-24 NOTE — Progress Notes (Signed)
   Subjective:    Patient ID: Derek SeverinSamer Hartnett, male    DOB: 01/30/1981, 37 y.o.   MRN: [id number]  HPI New to establish.  Previously saw [facility].  No current concerns.   Review of Systems Patient reports no vision/hearing changes, anorexia, fever ,adenopathy, persistant/recurrent hoarseness, swallowing issues, chest pain, palpitations, edema, persistant/recurrent cough, hemoptysis, dyspnea (rest,exertional, paroxysmal nocturnal), gastrointestinal  bleeding (melena, rectal bleeding), abdominal pain, GU symptoms (dysuria, hematuria, voiding/incontinence issues) syncope, focal weakness, memory loss, numbness & tingling, skin/hair/nail changes, depression, anxiety, abnormal bruising/bleeding, musculoskeletal symptoms/signs.   + GERD- food dependent.  Previously on prescription medication w/ results.  No relief w/ OTC meds.    Objective:   Physical Exam General Appearance:    Alert, cooperative, no distress, appears stated age  Head:    Normocephalic, without obvious abnormality, atraumatic  Eyes:    PERRL, conjunctiva/corneas clear, EOM's intact, fundi    benign, both eyes       Ears:    Normal TM's and external ear canals, both ears  Nose:   Nares normal, septum midline, mucosa normal, no drainage   or sinus tenderness  Throat:   Lips, mucosa, and tongue normal; teeth and gums normal  Neck:   Supple, symmetrical, trachea midline, no adenopathy;       thyroid:  No enlargement/tenderness/nodules  Back:     Symmetric, no curvature, ROM normal, no CVA tenderness  Lungs:     Clear to auscultation bilaterally, respirations unlabored  Chest wall:    No tenderness or deformity  Heart:    Regular rate and rhythm, S1 and S2 normal, no murmur, rub   or gallop  Abdomen:     Soft, non-tender, bowel sounds active all four quadrants,    no masses, no organomegaly  Genitalia:    Normal male without lesion, masses,discharge or tenderness  Rectal:    Deferred due to young age  Extremities:    Extremities normal, atraumatic, no cyanosis or edema  Pulses:   2+ and symmetric all extremities  Skin:   Skin color, texture, turgor normal, no rashes or lesions  Lymph nodes:   Cervical, supraclavicular, and axillary nodes normal  Neurologic:   CNII-XII intact. Normal strength, sensation and reflexes      throughout          Assessment & Plan:

## 2017-09-24 NOTE — Assessment & Plan Note (Signed)
Pt's PE WNL.  Unclear on date of last Tdap.  Will attempt to get records from previous MD.  Check labs.  Anticipatory guidance provided.

## 2017-09-24 NOTE — Assessment & Plan Note (Signed)
New to provider, ongoing for pt.  No relief w/ OTC meds.  Previously controlled on prescription med but pt not aware of name.  Will start low dose Omeprazole and monitor for improvement.

## 2017-09-24 NOTE — Patient Instructions (Signed)
Follow up in 1 year or as needed We'll notify you of your lab results and make any changes if needed Continue to work on healthy diet and regular exercise- you look great!! Start the Omeprazole once daily for acid reflux Call with any questions or concerns Welcome!  We're glad to have you!!!

## 2017-09-25 ENCOUNTER — Other Ambulatory Visit: Payer: Self-pay | Admitting: General Practice

## 2017-09-25 MED ORDER — VITAMIN D (ERGOCALCIFEROL) 1.25 MG (50000 UNIT) PO CAPS: 50000.0000 [IU] | ORAL_CAPSULE | ORAL | 0 refills | Status: DC

## 2017-12-11 ENCOUNTER — Other Ambulatory Visit: Payer: Self-pay | Admitting: Family Medicine

## 2018-03-17 ENCOUNTER — Other Ambulatory Visit: Payer: Self-pay | Admitting: Family Medicine

## 2018-09-26 ENCOUNTER — Encounter: Payer: BLUE CROSS/BLUE SHIELD | Admitting: Family Medicine

## 2019-01-13 ENCOUNTER — Encounter: Payer: BLUE CROSS/BLUE SHIELD | Admitting: Family Medicine

## 2019-01-30 ENCOUNTER — Encounter: Payer: BLUE CROSS/BLUE SHIELD | Admitting: Family Medicine

## 2019-12-14 ENCOUNTER — Encounter: Payer: Self-pay | Admitting: Family Medicine

## 2019-12-14 ENCOUNTER — Ambulatory Visit (INDEPENDENT_AMBULATORY_CARE_PROVIDER_SITE_OTHER): Payer: 59 | Admitting: Family Medicine

## 2019-12-14 ENCOUNTER — Other Ambulatory Visit: Payer: Self-pay

## 2019-12-14 VITALS — BP 124/78 | HR 57 | Temp 97.9°F | Resp 16 | Wt 185.0 lb

## 2019-12-14 DIAGNOSIS — R319 Hematuria, unspecified: Secondary | ICD-10-CM

## 2019-12-14 DIAGNOSIS — R1032 Left lower quadrant pain: Secondary | ICD-10-CM

## 2019-12-14 DIAGNOSIS — E663 Overweight: Secondary | ICD-10-CM

## 2019-12-14 DIAGNOSIS — R6882 Decreased libido: Secondary | ICD-10-CM

## 2019-12-14 DIAGNOSIS — Z23 Encounter for immunization: Secondary | ICD-10-CM | POA: Diagnosis not present

## 2019-12-14 DIAGNOSIS — R109 Unspecified abdominal pain: Secondary | ICD-10-CM | POA: Diagnosis not present

## 2019-12-14 DIAGNOSIS — E559 Vitamin D deficiency, unspecified: Secondary | ICD-10-CM

## 2019-12-14 LAB — CBC WITH DIFFERENTIAL/PLATELET
Basophils Absolute: 0.1 10*3/uL (ref 0.0–0.1)
Basophils Relative: 1.1 % (ref 0.0–3.0)
Eosinophils Absolute: 0.2 10*3/uL (ref 0.0–0.7)
Eosinophils Relative: 4.1 % (ref 0.0–5.0)
HCT: 45.3 % (ref 39.0–52.0)
Hemoglobin: 15.3 g/dL (ref 13.0–17.0)
Lymphocytes Relative: 43.5 % (ref 12.0–46.0)
Lymphs Abs: 2.2 10*3/uL (ref 0.7–4.0)
MCHC: 33.9 g/dL (ref 30.0–36.0)
MCV: 83.9 fl (ref 78.0–100.0)
Monocytes Absolute: 0.5 10*3/uL (ref 0.1–1.0)
Monocytes Relative: 9.2 % (ref 3.0–12.0)
Neutro Abs: 2.1 10*3/uL (ref 1.4–7.7)
Neutrophils Relative %: 42.1 % — ABNORMAL LOW (ref 43.0–77.0)
Platelets: 185 10*3/uL (ref 150.0–400.0)
RBC: 5.4 Mil/uL (ref 4.22–5.81)
RDW: 13.7 % (ref 11.5–15.5)
WBC: 5.1 10*3/uL (ref 4.0–10.5)

## 2019-12-14 LAB — BASIC METABOLIC PANEL
BUN: 17 mg/dL (ref 6–23)
CO2: 28 mEq/L (ref 19–32)
Calcium: 9.5 mg/dL (ref 8.4–10.5)
Chloride: 103 mEq/L (ref 96–112)
Creatinine, Ser: 0.75 mg/dL (ref 0.40–1.50)
GFR: 113.25 mL/min (ref >60.00)
Glucose, Bld: 79 mg/dL (ref 70–99)
Potassium: 3.9 mEq/L (ref 3.5–5.1)
Sodium: 139 mEq/L (ref 135–145)

## 2019-12-14 LAB — LIPID PANEL
Cholesterol: 175 mg/dL (ref 0–200)
HDL: 54.5 mg/dL (ref >39.00)
LDL Cholesterol: 103 mg/dL — ABNORMAL HIGH (ref 0–99)
NonHDL: 120.17
Total CHOL/HDL Ratio: 3
Triglycerides: 86 mg/dL (ref 0.0–149.0)
VLDL: 17.2 mg/dL (ref 0.0–40.0)

## 2019-12-14 LAB — HEPATIC FUNCTION PANEL
ALT: 19 U/L (ref 0–53)
AST: 14 U/L (ref 0–37)
Albumin: 4.8 g/dL (ref 3.5–5.2)
Alkaline Phosphatase: 53 U/L (ref 39–117)
Bilirubin, Direct: 0.2 mg/dL (ref 0.0–0.3)
Total Bilirubin: 1.1 mg/dL (ref 0.2–1.2)
Total Protein: 7.2 g/dL (ref 6.0–8.3)

## 2019-12-14 LAB — POCT URINALYSIS DIPSTICK
Bilirubin, UA: NEGATIVE
Blood, UA: POSITIVE
Glucose, UA: NEGATIVE
Ketones, UA: NEGATIVE
Leukocytes, UA: NEGATIVE
Nitrite, UA: NEGATIVE
Protein, UA: NEGATIVE
Spec Grav, UA: 1.015 (ref 1.010–1.025)
Urobilinogen, UA: 0.2 E.U./dL
pH, UA: 6 (ref 5.0–8.0)

## 2019-12-14 LAB — TSH: TSH: 2.1 u[IU]/mL (ref 0.35–4.50)

## 2019-12-14 LAB — VITAMIN D 25 HYDROXY (VIT D DEFICIENCY, FRACTURES): VITD: 34.85 ng/mL (ref 30.00–100.00)

## 2019-12-14 LAB — TESTOSTERONE: Testosterone: 298.01 ng/dL — ABNORMAL LOW (ref 300.00–890.00)

## 2019-12-14 NOTE — Assessment & Plan Note (Signed)
Pt has hx of this.  Check labs and replete prn. 

## 2019-12-14 NOTE — Assessment & Plan Note (Signed)
BMI is now 27.72  Not getting regularly exercise or following particular diet.  Encouraged daily activity and healthy diet.  Check labs to risk stratify.  Will follow.

## 2019-12-14 NOTE — Patient Instructions (Addendum)
Please schedule your complete physical in 6 months We'll notify you of your lab results and make any changes if needed We'll call you with your CT appt to assess for kidney stone Continue to work on healthy diet and regular exercise Continue to drink plenty of fluids, ibuprofen as needed Call with any questions or concerns Hang in there!

## 2019-12-14 NOTE — Progress Notes (Signed)
   Subjective:    Patient ID: Derek Luna, male    DOB: 1980/03/17, 39 y.o.   MRN: [id number]  HPI Kidney stones- L sided, severe pain '2 days in a row'.  No pain today.  Has increased water intake.  No visible blood in urine.  Has not seen a stone and is not aware that he passed one.  Pain started in L flank and traveled around to L pelvis.  No hx of similar.  No dysuria.  Some frequency but also increased water intake  Overweight- BMI is 27.72.  No regular exercise.  Not following a particular diet.  No CP, SOB, HAs, abd pain, N/V, edema.  Decreased libido- pt is concerned b/c he is less interested than previously and seems to have a more 'difficult time'.  Wants flu shot today.  Will do Tdap.   Review of Systems For ROS see HPI   This visit occurred during the SARS-CoV-2 public health emergency.  Safety protocols were in place, including screening questions prior to the visit, additional usage of staff PPE, and extensive cleaning of exam room while observing appropriate contact time as indicated for disinfecting solutions.       Objective:   Physical Exam Vitals reviewed.  Constitutional:      General: He is not in acute distress.    Appearance: Normal appearance. He is well-developed.  HENT:     Head: Normocephalic and atraumatic.  Eyes:     Conjunctiva/sclera: Conjunctivae normal.     Pupils: Pupils are equal, round, and reactive to light.  Neck:     Thyroid: No thyromegaly.  Cardiovascular:     Rate and Rhythm: Normal rate and regular rhythm.     Heart sounds: Normal heart sounds. No murmur heard.   Pulmonary:     Effort: Pulmonary effort is normal. No respiratory distress.     Breath sounds: Normal breath sounds.  Abdominal:     General: Bowel sounds are normal. There is no distension.     Palpations: Abdomen is soft.     Tenderness: There is abdominal tenderness (mild TTP over LLQ). There is no right CVA tenderness, left CVA tenderness, guarding or rebound.    Musculoskeletal:     Cervical back: Normal range of motion and neck supple.     Right lower leg: No edema.     Left lower leg: No edema.  Lymphadenopathy:     Cervical: No cervical adenopathy.  Skin:    General: Skin is warm and dry.  Neurological:     Mental Status: He is alert and oriented to person, place, and time.     Cranial Nerves: No cranial nerve deficit.  Psychiatric:        Behavior: Behavior normal.           Assessment & Plan:  L flank pain- new.  sxs started 2 days ago.  No pain this AM.  No visible blood, dysuria.  Some frequency but he has increased his water intake significantly.  Will get UA and CT stone study.  Pt expressed understanding and is in agreement w/ plan.   Decreased libido- check testosterone level.

## 2019-12-15 ENCOUNTER — Other Ambulatory Visit: Payer: Self-pay | Admitting: General Practice

## 2019-12-15 DIAGNOSIS — R7989 Other specified abnormal findings of blood chemistry: Secondary | ICD-10-CM

## 2019-12-15 LAB — URINE CULTURE
MICRO NUMBER:: 11113962
SPECIMEN QUALITY:: ADEQUATE

## 2019-12-20 ENCOUNTER — Encounter: Payer: Self-pay | Admitting: Family Medicine

## 2019-12-21 NOTE — Telephone Encounter (Signed)
Pt made aware that the referral is being reviewed and the office will contact him to schedule once the referral is accepted.

## 2019-12-29 ENCOUNTER — Ambulatory Visit
Admission: RE | Admit: 2019-12-29 | Discharge: 2019-12-29 | Disposition: A | Discharge status: Home / Self Care | Payer: 59 | Source: Ambulatory Visit | Attending: Family Medicine | Admitting: Family Medicine

## 2019-12-29 DIAGNOSIS — R1032 Left lower quadrant pain: Secondary | ICD-10-CM

## 2019-12-30 ENCOUNTER — Other Ambulatory Visit: Payer: Self-pay

## 2019-12-30 ENCOUNTER — Encounter: Payer: Self-pay | Admitting: Family Medicine

## 2019-12-30 DIAGNOSIS — N200 Calculus of kidney: Secondary | ICD-10-CM

## 2019-12-30 MED ORDER — TAMSULOSIN HCL 0.4 MG PO CAPS: 0.4000 mg | ORAL_CAPSULE | Freq: Every evening | ORAL | 0 refills | Status: DC

## 2020-01-21 ENCOUNTER — Other Ambulatory Visit: Payer: Self-pay | Admitting: Family Medicine

## 2020-01-21 DIAGNOSIS — N200 Calculus of kidney: Secondary | ICD-10-CM

## 2020-02-14 ENCOUNTER — Other Ambulatory Visit: Payer: Self-pay | Admitting: Family Medicine

## 2020-02-14 DIAGNOSIS — N200 Calculus of kidney: Secondary | ICD-10-CM

## 2020-02-26 ENCOUNTER — Ambulatory Visit (INDEPENDENT_AMBULATORY_CARE_PROVIDER_SITE_OTHER): Payer: 59 | Admitting: Endocrinology

## 2020-02-26 ENCOUNTER — Ambulatory Visit: Payer: 59 | Admitting: Endocrinology

## 2020-02-26 ENCOUNTER — Other Ambulatory Visit: Payer: Self-pay

## 2020-02-26 ENCOUNTER — Encounter: Payer: Self-pay | Admitting: Endocrinology

## 2020-02-26 DIAGNOSIS — R7989 Other specified abnormal findings of blood chemistry: Secondary | ICD-10-CM | POA: Diagnosis not present

## 2020-02-26 LAB — FOLLICLE STIMULATING HORMONE: FSH: 6.5 m[IU]/mL (ref 1.4–18.1)

## 2020-02-26 LAB — LUTEINIZING HORMONE: LH: 4.75 m[IU]/mL (ref 1.50–9.30)

## 2020-02-26 NOTE — Progress Notes (Signed)
Subjective:    Patient ID: Derek Luna, male    DOB: 04-27-1980, 40 y.o.   MRN: [id number]  HPI Pt is referred by Dr Beverely Low, for low testosterone.  Pt reports he had puberty at the normal age.  He has 2 biological children (2018--required IVF, and 2019--did not require any rx).  He says he has never taken illicit androgens.  He has never had pituitary imaging. He took an oral rx for a few mos in 2012, and then took testosterone injections x a few mos, in 2018.  He does not take antiandrogens or opioids.  He denies any h/o XRT or genital infection.  He has never had surgery, or a serious injury to the head or genital area. He has no h/o sleep apnea or DVT.   He does not consume alcohol excessively.  pt states he feels well in general, except for decreased libido.  He does not wish to have any more children.   Past Medical History:  Diagnosis Date   Allergic rhinitis 07/04/2012    History reviewed. No pertinent surgical history.  Social History   Socioeconomic History   Marital status: Married    Spouse name: Not on file   Number of children: Not on file   Years of education: Not on file   Highest education level: Not on file  Occupational History   Not on file  Tobacco Use   Smoking status: Never Smoker   Smokeless tobacco: Never Used  Vaping Use   Vaping Use: Never used  Substance and Sexual Activity   Alcohol use: No   Drug use: No   Sexual activity: Yes  Other Topics Concern   Not on file  Social History Narrative   Not on file   Social Determinants of Health   Financial Resource Strain: Not on file  Food Insecurity: Not on file  Transportation Needs: Not on file  Physical Activity: Not on file  Stress: Not on file  Social Connections: Not on file  Intimate Partner Violence: Not on file    Current Outpatient Medications on File Prior to Visit  Medication Sig Dispense Refill   ibuprofen (ADVIL,MOTRIN) 600 MG tablet Take 1 tablet (600 mg total) by  mouth every 6 (six) hours as needed. 30 tablet 0   omeprazole (PRILOSEC) 20 MG capsule TAKE 1 CAPSULE BY MOUTH EVERY DAY 90 capsule 1   tamsulosin (FLOMAX) 0.4 MG CAPS capsule TAKE 1 CAPSULE BY MOUTH EVERYDAY AT BEDTIME 30 capsule 0   Vitamin D, Ergocalciferol, (DRISDOL) 50000 units CAPS capsule Take 1 capsule (50,000 Units total) by mouth every 7 (seven) days. 12 capsule 0   fluticasone (FLONASE) 50 MCG/ACT nasal spray Place 2 sprays into the nose daily. (Patient not taking: No sig reported) 16 g 6   loratadine (CLARITIN) 10 MG tablet Take 1 tablet (10 mg total) by mouth daily. (Patient not taking: No sig reported) 30 tablet 11   No current facility-administered medications on file prior to visit.    No Known Allergies  Family History  Problem Relation Age of Onset   Hypertension Father     BP 132/88    Pulse 68    Ht 5' 8.5" (1.74 m)    Wt 185 lb (83.9 kg)    SpO2 98%    BMI 27.72 kg/m     Review of Systems denies depression, numbness, weight change, muscle weakness, headache, and sob.      Objective:   Physical Exam VS: see vs  page GEN: no distress HEAD: head: no deformity eyes: no periorbital swelling, no proptosis external nose and ears are normal NECK: supple, thyroid is not enlarged CHEST WALL: no deformity LUNGS: clear to auscultation CV: reg rate and rhythm, no murmur.  GENITALIA: normal male testicles, scrotum, and penis.   MUSCULOSKELETAL: gait is normal and steady EXTEMITIES: no deformity.  no leg edema NEURO:  readily moves all 4's.  sensation is intact to touch on all 4's SKIN:  Normal texture and temperature.  No rash or suspicious lesion is visible.  Normal male hair distribution.   NODES:  None palpable at the neck PSYCH: alert, well-oriented.  Does not appear anxious nor depressed.  Lab Results  Component Value Date   TESTOSTERONE 298.01 (L) 12/14/2019   Lab Results  Component Value Date   TSH 2.10 12/14/2019   Lab Results  Component Value  Date   WBC 5.1 12/14/2019   HGB 15.3 12/14/2019   HCT 45.3 12/14/2019   MCV 83.9 12/14/2019   PLT 185.0 12/14/2019   Lab Results  Component Value Date   ALT 19 12/14/2019   AST 14 12/14/2019   ALKPHOS 53 12/14/2019   BILITOT 1.1 12/14/2019       Assessment & Plan:  Low testosterone, new to me.  uncertain etiology and prognosis.  Decreased libido: I told pt this has many poss causes, and can be difficult to rx.    Patient Instructions  Blood tests are requested for you today.  We'll let you know about the results.  Based on the results, I may need to prescribe for you a pill to increase the testosterone.  Testosterone treatment has risks, including increased or decreased fertility (depending on the type of treatment), hair loss, prostate cancer, benign prostate enlargement, blood clots, liver problems, lower hdl ("good cholesterol"), polycythemia (opposite of anemia), sleep apnea, and behavior changes.

## 2020-02-26 NOTE — Patient Instructions (Signed)
Blood tests are requested for you today.  We'll let you know about the results.  Based on the results, I may need to prescribe for you a pill to increase the testosterone.  Testosterone treatment has risks, including increased or decreased fertility (depending on the type of treatment), hair loss, prostate cancer, benign prostate enlargement, blood clots, liver problems, lower hdl ("good cholesterol"), polycythemia (opposite of anemia), sleep apnea, and behavior changes.

## 2020-02-27 LAB — PROLACTIN: Prolactin: 7.1 ng/mL (ref 2.0–18.0)

## 2020-02-28 ENCOUNTER — Encounter: Payer: Self-pay | Admitting: Endocrinology

## 2020-02-28 LAB — TESTOSTERONE,FREE AND TOTAL
Testosterone, Free: 10.4 pg/mL (ref 6.8–21.5)
Testosterone: 404 ng/dL (ref 264–916)

## 2020-03-04 ENCOUNTER — Other Ambulatory Visit: Payer: Self-pay | Admitting: Family Medicine

## 2020-03-04 DIAGNOSIS — N200 Calculus of kidney: Secondary | ICD-10-CM

## 2020-08-17 ENCOUNTER — Encounter: Payer: Self-pay | Admitting: *Deleted

## 2020-11-02 ENCOUNTER — Encounter: Payer: Self-pay | Admitting: Family Medicine

## 2020-11-02 ENCOUNTER — Telehealth (INDEPENDENT_AMBULATORY_CARE_PROVIDER_SITE_OTHER): Payer: 59 | Admitting: Family Medicine

## 2020-11-02 DIAGNOSIS — R059 Cough, unspecified: Secondary | ICD-10-CM

## 2020-11-02 MED ORDER — CETIRIZINE HCL 10 MG PO TABS: 10.0000 mg | ORAL_TABLET | Freq: Every day | ORAL | 1 refills | Status: DC

## 2020-11-02 NOTE — Progress Notes (Signed)
Virtual Visit via Video Note  I connected with Derek Luna on 11/02/20 at  4:00 PM EDT by a video enabled telemedicine application 2/2 COVID-19 pandemic and verified that I am speaking with the correct person using two identifiers.  Location patient: home Location provider:work or home office Persons participating in the virtual visit: patient, provider  I discussed the limitations of evaluation and management by telemedicine and the availability of in person appointments. The patient expressed understanding and agreed to proceed.   HPI: Pt endorses having a dry cough x 2 months. Cough starts first thing in am and continues throughout the day.  Pt denies seasonal allergies, GERD, h/o asthma, fever, HAs, changes in meds, or sick contacts.  Also denies fever, headaches, rhinorrhea, nausea, vomiting.  Pt tried Delsym.   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  Patient notes increased coughing when in the warehouse.  Pt has dinner at 5 pm.  May have snack after dinner.  In bed by 1030, 11 PM or later.  Per chart review previously on Claritin and PPI.  ROS: See pertinent positives and negatives per HPI.  Past Medical History:  Diagnosis Date   Allergic rhinitis 07/04/2012    No past surgical history on file.  Family History  Problem Relation Age of Onset   Hypertension Father      Current Outpatient Medications:    fluticasone (FLONASE) 50 MCG/ACT nasal spray, Place 2 sprays into the nose daily., Disp: 16 g, Rfl: 6   ibuprofen (ADVIL,MOTRIN) 600 MG tablet, Take 1 tablet (600 mg total) by mouth every 6 (six) hours as needed., Disp: 30 tablet, Rfl: 0   loratadine (CLARITIN) 10 MG tablet, Take 1 tablet (10 mg total) by mouth daily., Disp: 30 tablet, Rfl: 11   omeprazole (PRILOSEC) 20 MG capsule, TAKE 1 CAPSULE BY MOUTH EVERY DAY, Disp: 90 capsule, Rfl: 1   tamsulosin (FLOMAX) 0.4 MG CAPS capsule, TAKE 1 CAPSULE BY MOUTH EVERYDAY AT BEDTIME, Disp: 90 capsule,  Rfl: 1   Vitamin D, Ergocalciferol, (DRISDOL) 50000 units CAPS capsule, Take 1 capsule (50,000 Units total) by mouth every 7 (seven) days., Disp: 12 capsule, Rfl: 0  EXAM:  VITALS per patient if applicable: RR between 12-20 bpm  GENERAL: alert, oriented, appears well and in no acute distress  HEENT: atraumatic, conjunctiva clear, no obvious abnormalities on inspection of external nose and ears  NECK: normal movements of the head and neck  LUNGS: Intermittent dry cough, on inspection no signs of respiratory distress, breathing rate appears normal, no obvious gross SOB, gasping or wheezing  CV: no obvious cyanosis  MS: moves all visible extremities without noticeable abnormality  PSYCH/NEURO: pleasant and cooperative, no obvious depression or anxiety, speech and thought processing grossly intact  ASSESSMENT AND PLAN:  Discussed the following assessment and plan:  Cough -Discussed possible causes including seasonal allergies, GERD, reactive airway disease -We will start Zyrtec daily. -Given duration of cough will obtain CXR -If no improvement in symptoms with Zyrtec consider PPI. - Plan: DG Chest 2 View, cetirizine (ZYRTEC) 10 MG tablet  Follow-up with PCP.   I discussed the assessment and treatment plan with the patient. The patient was provided an opportunity to ask questions and all were answered. The patient agreed with the plan and demonstrated an understanding of the instructions.   The patient was advised to call back or seek an in-person evaluation if the symptoms worsen or if the condition fails to improve as anticipated.  Carollee Herter  Merlene Laughter, MD

## 2020-11-10 ENCOUNTER — Ambulatory Visit (INDEPENDENT_AMBULATORY_CARE_PROVIDER_SITE_OTHER)
Admission: RE | Admit: 2020-11-10 | Discharge: 2020-11-10 | Disposition: A | Discharge status: Home / Self Care | Payer: 59 | Source: Ambulatory Visit | Attending: Family Medicine | Admitting: Family Medicine

## 2020-11-10 ENCOUNTER — Other Ambulatory Visit: Payer: Self-pay

## 2020-11-10 DIAGNOSIS — R059 Cough, unspecified: Secondary | ICD-10-CM | POA: Diagnosis not present

## 2020-11-24 ENCOUNTER — Other Ambulatory Visit: Payer: Self-pay | Admitting: Family Medicine

## 2020-11-24 DIAGNOSIS — R059 Cough, unspecified: Secondary | ICD-10-CM

## 2021-01-11 ENCOUNTER — Encounter: Payer: 59 | Admitting: Family Medicine

## 2021-03-03 ENCOUNTER — Encounter: Payer: Self-pay | Admitting: Family Medicine

## 2021-03-03 ENCOUNTER — Ambulatory Visit (INDEPENDENT_AMBULATORY_CARE_PROVIDER_SITE_OTHER): Payer: Managed Care, Other (non HMO) | Admitting: Family Medicine

## 2021-03-03 VITALS — BP 122/88 | HR 68 | Temp 98.7°F | Resp 16 | Ht 68.5 in | Wt 176.2 lb

## 2021-03-03 DIAGNOSIS — Z0000 Encounter for general adult medical examination without abnormal findings: Secondary | ICD-10-CM | POA: Diagnosis not present

## 2021-03-03 DIAGNOSIS — E663 Overweight: Secondary | ICD-10-CM | POA: Diagnosis not present

## 2021-03-03 DIAGNOSIS — Z1159 Encounter for screening for other viral diseases: Secondary | ICD-10-CM

## 2021-03-03 DIAGNOSIS — E559 Vitamin D deficiency, unspecified: Secondary | ICD-10-CM | POA: Diagnosis not present

## 2021-03-03 DIAGNOSIS — R7989 Other specified abnormal findings of blood chemistry: Secondary | ICD-10-CM | POA: Diagnosis not present

## 2021-03-03 DIAGNOSIS — Z114 Encounter for screening for human immunodeficiency virus [HIV]: Secondary | ICD-10-CM

## 2021-03-03 LAB — CBC WITH DIFFERENTIAL/PLATELET
Basophils Absolute: 0.1 10*3/uL (ref 0.0–0.1)
Basophils Relative: 1.5 % (ref 0.0–3.0)
Eosinophils Absolute: 0.1 10*3/uL (ref 0.0–0.7)
Eosinophils Relative: 2.4 % (ref 0.0–5.0)
HCT: 45.6 % (ref 39.0–52.0)
Hemoglobin: 14.6 g/dL (ref 13.0–17.0)
Lymphocytes Relative: 37.5 % (ref 12.0–46.0)
Lymphs Abs: 1.6 10*3/uL (ref 0.7–4.0)
MCHC: 32.1 g/dL (ref 30.0–36.0)
MCV: 86.8 fl (ref 78.0–100.0)
Monocytes Absolute: 0.4 10*3/uL (ref 0.1–1.0)
Monocytes Relative: 9.1 % (ref 3.0–12.0)
Neutro Abs: 2.1 10*3/uL (ref 1.4–7.7)
Neutrophils Relative %: 49.5 % (ref 43.0–77.0)
Platelets: 203 10*3/uL (ref 150.0–400.0)
RBC: 5.26 Mil/uL (ref 4.22–5.81)
RDW: 13.6 % (ref 11.5–15.5)
WBC: 4.2 10*3/uL (ref 4.0–10.5)

## 2021-03-03 LAB — BASIC METABOLIC PANEL
BUN: 19 mg/dL (ref 6–23)
CO2: 27 mEq/L (ref 19–32)
Calcium: 9.7 mg/dL (ref 8.4–10.5)
Chloride: 102 mEq/L (ref 96–112)
Creatinine, Ser: 0.84 mg/dL (ref 0.40–1.50)
GFR: 108.51 mL/min (ref >60.00)
Glucose, Bld: 88 mg/dL (ref 70–99)
Potassium: 4.3 mEq/L (ref 3.5–5.1)
Sodium: 137 mEq/L (ref 135–145)

## 2021-03-03 LAB — HEPATIC FUNCTION PANEL
ALT: 13 U/L (ref 0–53)
AST: 14 U/L (ref 0–37)
Albumin: 4.6 g/dL (ref 3.5–5.2)
Alkaline Phosphatase: 52 U/L (ref 39–117)
Bilirubin, Direct: 0.2 mg/dL (ref 0.0–0.3)
Total Bilirubin: 1.3 mg/dL — ABNORMAL HIGH (ref 0.2–1.2)
Total Protein: 7 g/dL (ref 6.0–8.3)

## 2021-03-03 LAB — VITAMIN D 25 HYDROXY (VIT D DEFICIENCY, FRACTURES): VITD: 43.83 ng/mL (ref 30.00–100.00)

## 2021-03-03 LAB — LIPID PANEL
Cholesterol: 155 mg/dL (ref 0–200)
HDL: 60.2 mg/dL (ref >39.00)
LDL Cholesterol: 81 mg/dL (ref 0–99)
NonHDL: 95.02
Total CHOL/HDL Ratio: 3
Triglycerides: 70 mg/dL (ref 0.0–149.0)
VLDL: 14 mg/dL (ref 0.0–40.0)

## 2021-03-03 LAB — TESTOSTERONE: Testosterone: 369.22 ng/dL (ref 300.00–890.00)

## 2021-03-03 LAB — TSH: TSH: 1.69 u[IU]/mL (ref 0.35–5.50)

## 2021-03-03 NOTE — Patient Instructions (Signed)
Follow up in 1 year or as needed We'll notify you of your lab results and make any changes if needed Keep up the good work on healthy diet and regular exercise- you look great! Call with any questions or concerns Stay Safe!  Stay Healthy! Happy New Year!! 

## 2021-03-03 NOTE — Assessment & Plan Note (Signed)
Check labs and replete prn. 

## 2021-03-03 NOTE — Assessment & Plan Note (Signed)
Pt has lost 9 lbs since last visit.  Applauded his efforts at healthy diet and regular exercise.  Check labs to risk stratify.  Will follow.

## 2021-03-03 NOTE — Assessment & Plan Note (Signed)
Pt's PE WNL.  UTD on Tdap, flu.  Check labs.  Anticipatory guidance provided.  

## 2021-03-03 NOTE — Progress Notes (Signed)
° °  Subjective:    Patient ID: Derek Luna, male    DOB: 21-Nov-1980, 41 y.o.   MRN: [id number]  HPI CPE- UTD on Tdap, flu.  Pt is down 9 lbs since last visit.  Pt is going to the gym daily  Health Maintenance  Topic Date Due   HIV Screening  Never done   Hepatitis C Screening  Never done   COVID-19 Vaccine (4 - Booster for Pfizer series) 06/13/2020   TETANUS/TDAP  12/13/2029   INFLUENZA VACCINE  Completed   Pneumococcal Vaccine 33-60 Years old  Aged Out   HPV VACCINES  Aged Out      Review of Systems Patient reports no vision/hearing changes, anorexia, fever ,adenopathy, persistant/recurrent hoarseness, swallowing issues, chest pain, palpitations, edema, persistant/recurrent cough, hemoptysis, dyspnea (rest,exertional, paroxysmal nocturnal), gastrointestinal  bleeding (melena, rectal bleeding), abdominal pain, excessive heart burn, GU symptoms (dysuria, hematuria, voiding/incontinence issues) syncope, focal weakness, memory loss, numbness & tingling, skin/hair/nail changes, depression, anxiety, abnormal bruising/bleeding, musculoskeletal symptoms/signs.   This visit occurred during the SARS-CoV-2 public health emergency.  Safety protocols were in place, including screening questions prior to the visit, additional usage of staff PPE, and extensive cleaning of exam room while observing appropriate contact time as indicated for disinfecting solutions.      Objective:   Physical Exam General Appearance:    Alert, cooperative, no distress, appears stated age  Head:    Normocephalic, without obvious abnormality, atraumatic  Eyes:    PERRL, conjunctiva/corneas clear, EOM's intact, fundi    benign, both eyes       Ears:    Normal TM's and external ear canals, both ears  Nose:   Nares normal, septum midline, mucosa normal, no drainage   or sinus tenderness  Throat:   Lips, mucosa, and tongue normal; teeth and gums normal  Neck:   Supple, symmetrical, trachea midline, no adenopathy;        thyroid:  No enlargement/tenderness/nodules  Back:     Symmetric, no curvature, ROM normal, no CVA tenderness  Lungs:     Clear to auscultation bilaterally, respirations unlabored  Chest wall:    No tenderness or deformity  Heart:    Regular rate and rhythm, S1 and S2 normal, no murmur, rub   or gallop  Abdomen:     Soft, non-tender, bowel sounds active all four quadrants,    no masses, no organomegaly  Genitalia:    Normal male without lesion, masses,discharge or tenderness  Rectal:    Deferred due to young age  Extremities:   Extremities normal, atraumatic, no cyanosis or edema  Pulses:   2+ and symmetric all extremities  Skin:   Skin color, texture, turgor normal, no rashes or lesions  Lymph nodes:   Cervical, supraclavicular, and axillary nodes normal  Neurologic:   CNII-XII intact. Normal strength, sensation and reflexes      throughout          Assessment & Plan:

## 2021-03-03 NOTE — Assessment & Plan Note (Signed)
Check labs and start treatment prn.

## 2021-03-04 ENCOUNTER — Encounter: Payer: Self-pay | Admitting: Family Medicine

## 2021-03-06 ENCOUNTER — Encounter: Payer: Self-pay | Admitting: Family Medicine

## 2021-03-06 LAB — HIV ANTIBODY (ROUTINE TESTING W REFLEX): HIV 1&2 Ab, 4th Generation: NONREACTIVE

## 2021-03-06 LAB — HEPATITIS C ANTIBODY
Hepatitis C Ab: NONREACTIVE
SIGNAL TO CUT-OFF: 0.12 (ref <1.00)

## 2021-08-09 NOTE — Progress Notes (Signed)
[facility] [address] [place] [address] Phone: [phone] Subjective:   [name], am serving as a scribe for Dr. Antoine Primas.  I'm seeing this patient by the request  of:  Sheliah Hatch, MD  CC: Low back pain  DXA:JOINOMVEHM  Derek Luna is a 41 y.o. male coming in with complaint of LBP. Has had low back pain for months. Biofreeze helps with the pain. Workout was lifting heavy. Will take ibuprofen but doesn't help.        Past Medical History:  Diagnosis Date   Allergic rhinitis 07/04/2012   No past surgical history on file. Social History   Socioeconomic History   Marital status: Married    Spouse name: Not on file   Number of children: Not on file   Years of education: Not on file   Highest education level: Not on file  Occupational History   Not on file  Tobacco Use   Smoking status: Never   Smokeless tobacco: Never  Vaping Use   Vaping Use: Never used  Substance and Sexual Activity   Alcohol use: No   Drug use: No   Sexual activity: Yes  Other Topics Concern   Not on file  Social History Narrative   Not on file   Social Determinants of Health   Financial Resource Strain: Not on file  Food Insecurity: Not on file  Transportation Needs: Not on file  Physical Activity: Not on file  Stress: Not on file  Social Connections: Not on file   No Known Allergies Family History  Problem Relation Age of Onset   Hypertension Father       Current Outpatient Medications (Respiratory):    cetirizine (ZYRTEC) 10 MG tablet, Take 1 tablet (10 mg total) by mouth daily.  Current Outpatient Medications (Analgesics):    ibuprofen (ADVIL,MOTRIN) 600 MG tablet, Take 1 tablet (600 mg total) by mouth every 6 (six) hours as needed.   Current Outpatient Medications (Other):    tiZANidine (ZANAFLEX) 2 MG tablet, Take 1 tablet (2 mg total) by mouth at bedtime.   omeprazole (PRILOSEC) 20 MG capsule, TAKE 1 CAPSULE  BY MOUTH EVERY DAY   Reviewed prior external information including notes and imaging from  primary care provider As well as notes that were available from care everywhere and other healthcare systems.  Past medical history, social, surgical and family history all reviewed in electronic medical record.  No pertanent information unless stated regarding to the chief complaint.   Review of Systems:  No headache, visual changes, nausea, vomiting, diarrhea, constipation, dizziness, abdominal pain, skin rash, fevers, chills, night sweats, weight loss, swollen lymph nodes, body aches, joint swelling, chest pain, shortness of breath, mood changes. POSITIVE muscle aches  Objective  Blood pressure 110/72, pulse 69, height 5\' 8"  (1.727 m), weight 173 lb (78.5 kg), SpO2 98 %.   General: No apparent distress alert and oriented x3 mood and affect normal, dressed appropriately.  HEENT: Pupils equal, extraocular movements intact  Respiratory: Patient's speak in full sentences and does not appear short of breath  Cardiovascular: No lower extremity edema, non tender, no erythema  Low back exam does have significant loss of lordosis.  Significant tightness noted.  Difficulty with FABER test bilaterally. Negative straight leg test noted though.  Patient is neurovascular intact distally.  Osteopathic findings  L2 flexed rotated and side bent left Sacrum posterior shear noted    Impression and Recommendations:

## 2021-08-10 ENCOUNTER — Ambulatory Visit (INDEPENDENT_AMBULATORY_CARE_PROVIDER_SITE_OTHER): Payer: Commercial Managed Care - HMO

## 2021-08-10 ENCOUNTER — Ambulatory Visit (INDEPENDENT_AMBULATORY_CARE_PROVIDER_SITE_OTHER): Payer: Commercial Managed Care - HMO | Admitting: Family Medicine

## 2021-08-10 VITALS — BP 110/72 | HR 69 | Ht 68.0 in | Wt 173.0 lb

## 2021-08-10 DIAGNOSIS — M549 Dorsalgia, unspecified: Secondary | ICD-10-CM

## 2021-08-10 DIAGNOSIS — M545 Low back pain, unspecified: Secondary | ICD-10-CM | POA: Insufficient documentation

## 2021-08-10 DIAGNOSIS — M9904 Segmental and somatic dysfunction of sacral region: Secondary | ICD-10-CM | POA: Diagnosis not present

## 2021-08-10 DIAGNOSIS — M9903 Segmental and somatic dysfunction of lumbar region: Secondary | ICD-10-CM | POA: Diagnosis not present

## 2021-08-10 MED ORDER — TIZANIDINE HCL 2 MG PO TABS: 2.0000 mg | ORAL_TABLET | Freq: Every day | ORAL | 0 refills | Status: AC

## 2021-08-10 NOTE — Patient Instructions (Signed)
Zanaflex 2mg  Do prescribed exercises at least 3x a week Ice when bothersome See you again in 6-8 weeks

## 2021-08-10 NOTE — Assessment & Plan Note (Addendum)
   Decision today to treat with OMT was based on Physical Exam  After verbal consent patient was treated with HVLA, ME, FPR techniques in  thoracic, lumbar and sacral areas, Patient tolerated the procedure well with improvement in symptoms  Patient given exercises, stretches and lifestyle modifications  See medications in patient instructions if given  Patient will follow up in 4-8 weeks 

## 2021-08-10 NOTE — Assessment & Plan Note (Signed)
Patient does have pain over the sacroiliac joints bilaterally.  Patient did have significant decrease in range of motion but then after osteopathic manipulation did have improvement noted.  Patient does have tightness still noted though.  Given a muscle relaxer to take at night.  Warned of potential side effects.  In addition to this we discussed proper lifting mechanics.  Work with Product/process development scientist to avoid them in greater detail as well.  Follow-up with me again in 6 to 8 weeks.  Worsening pain consider the possibility Advanced imaging Laboratory work-up such as [id number]

## 2021-09-08 ENCOUNTER — Ambulatory Visit (INDEPENDENT_AMBULATORY_CARE_PROVIDER_SITE_OTHER): Payer: Commercial Managed Care - HMO | Admitting: Family Medicine

## 2021-09-08 ENCOUNTER — Encounter: Payer: Self-pay | Admitting: Family Medicine

## 2021-09-08 VITALS — BP 118/84 | HR 68 | Temp 98.2°F | Resp 16 | Ht 68.0 in | Wt 173.1 lb

## 2021-09-08 DIAGNOSIS — G4700 Insomnia, unspecified: Secondary | ICD-10-CM | POA: Diagnosis not present

## 2021-09-08 DIAGNOSIS — M545 Low back pain, unspecified: Secondary | ICD-10-CM

## 2021-09-08 DIAGNOSIS — G8929 Other chronic pain: Secondary | ICD-10-CM

## 2021-09-08 MED ORDER — TRAZODONE HCL 50 MG PO TABS
25.0000 mg | ORAL_TABLET | Freq: Every evening | ORAL | 3 refills | Status: AC | PRN
Start: 2021-09-08 — End: ?

## 2021-09-08 NOTE — Assessment & Plan Note (Signed)
New.  Start low dose Trazodone to try and improve quality and duration of sleep.  Discussed appropriate use, possible side effects.  Pt expressed understanding and is in agreement w/ plan.

## 2021-09-08 NOTE — Progress Notes (Signed)
   Subjective:    Patient ID: Derek Luna, male    DOB: 1981-02-16, 41 y.o.   MRN: [id number]  HPI Insomnia- pt is waking up 1-2x/night.  This has been going on for years.  Has difficulty getting back to sleep once awake.  Pt reports his max amount of sleep is 5 hrs/night.  Has not tried anything OTC and has never been on prescription medication.    Back pain- pt reports ongoing low back pain.  Has seen Sports Med but no relief.  No improvement w/ Ibuprofen or muscle relaxer.  Pt reports pain daily.  Unable to sit or stand for prolonged periods w/o discomfort.  Pain will start in sacrolumbar area and radiate around pelvis into groin.   Review of Systems For ROS see HPI     Objective:   Physical Exam Vitals reviewed.  Constitutional:      General: He is not in acute distress.    Appearance: Normal appearance. He is not ill-appearing.  HENT:     Head: Normocephalic and atraumatic.  Eyes:     Extraocular Movements: Extraocular movements intact.     Conjunctiva/sclera: Conjunctivae normal.     Pupils: Pupils are equal, round, and reactive to light.  Skin:    General: Skin is warm and dry.  Neurological:     General: No focal deficit present.     Mental Status: He is alert and oriented to person, place, and time.     Motor: No weakness.     Coordination: Coordination normal.     Gait: Gait normal.  Psychiatric:        Mood and Affect: Mood normal.        Behavior: Behavior normal.        Thought Content: Thought content normal.           Assessment & Plan:

## 2021-09-08 NOTE — Assessment & Plan Note (Signed)
Chronic problem.  No relief after seeing sports med or w/ ibuprofen and muscle relaxers.  Pt reports daily pain and is unable to sit or stand for a prolonged period of time.  Will refer to PT for evaluation and tx.  If no improvement, will need back specialist.  Pt expressed understanding and is in agreement w/ plan.

## 2021-09-08 NOTE — Patient Instructions (Signed)
Follow up as needed or as scheduled START the Trazodone nightly- start w/ 1/2 tab 30-60 minutes before bed.  Increase to full tab if needed We'll call you with your physical therapy appt for the back pain Call with any questions or concerns Hang in there!!

## 2021-09-19 ENCOUNTER — Ambulatory Visit (HOSPITAL_BASED_OUTPATIENT_CLINIC_OR_DEPARTMENT_OTHER): Payer: Commercial Managed Care - HMO | Attending: Family Medicine | Admitting: Physical Therapy

## 2021-09-19 ENCOUNTER — Encounter (HOSPITAL_BASED_OUTPATIENT_CLINIC_OR_DEPARTMENT_OTHER): Payer: Self-pay | Admitting: Physical Therapy

## 2021-09-19 ENCOUNTER — Other Ambulatory Visit: Payer: Self-pay

## 2021-09-19 DIAGNOSIS — G8929 Other chronic pain: Secondary | ICD-10-CM | POA: Diagnosis not present

## 2021-09-19 DIAGNOSIS — M6281 Muscle weakness (generalized): Secondary | ICD-10-CM | POA: Diagnosis present

## 2021-09-19 DIAGNOSIS — M5459 Other low back pain: Secondary | ICD-10-CM | POA: Diagnosis present

## 2021-09-19 DIAGNOSIS — M545 Low back pain, unspecified: Secondary | ICD-10-CM | POA: Insufficient documentation

## 2021-09-19 NOTE — Therapy (Signed)
OUTPATIENT PHYSICAL THERAPY THORACOLUMBAR EVALUATION   Patient Name: Derek Luna MRN: [id number] DOB:[birth year], 41 y.o., male Today's Date: 09/19/2021   PT End of Session - 09/19/21 1651     Visit Number 1    Number of Visits 15    Date for PT Re-Evaluation 12/18/21    Authorization Type [employer]    PT Start Time 1645    PT Stop Time 1730    PT Time Calculation (min) 45 min    Activity Tolerance Patient tolerated treatment well    Behavior During Therapy [facility] for tasks assessed/performed             Past Medical History:  Diagnosis Date   Allergic rhinitis 07/04/2012   History reviewed. No pertinent surgical history. Patient Active Problem List   Diagnosis Date Noted   Insomnia 09/08/2021   Low back pain 08/10/2021   Segmental and somatic dysfunction of sacral region 08/10/2021   Low testosterone 02/26/2020   Vitamin D deficiency 12/14/2019   Overweight (BMI 25.0-29.9) 12/14/2019   Physical exam 09/24/2017   GERD (gastroesophageal reflux disease) 09/24/2017   Nasal congestion with rhinorrhea 07/05/2012   Snoring 07/05/2012   Allergic rhinitis 07/04/2012    PCP: Sheliah Hatch, MD  REFERRING PROVIDER: Sheliah Hatch, MD  REFERRING DIAG: M54.50,G89.29 (ICD-10-CM) - Chronic bilateral low back pain without sciatica  Rationale for Evaluation and Treatment Rehabilitation  THERAPY DIAG:  Other low back pain  Muscle weakness (generalized)  ONSET DATE: 06/2021  SUBJECTIVE:                                                                                                                                                                                           SUBJECTIVE STATEMENT: Pt states he is here for his low back pain and bilateral arm pain. Pt states it started with lifting/working out when it happened. He started having pain into his back. The pain started increasing and wrapped from his SIJ and into his bilat groin. He has stopped working out and has  only done the treadmill due to the pain. Pt denies NT into legs. He cannot sit or stand for more than 10 mins without. Pain does not affect sleep. Worst pain is when lifting anything heavy. Squatting down and LE dressing is very painful. Pt denies 5D's and 3N's. Pt denies cancer red flags, GI questions, and systemic issues. Has had kidney stones in the past.   Pt states he is unable to sit or stand for prolonged periods w/o discomfort.  Pain will start in sacrolumbar area and radiate around pelvis into groin   PERTINENT HISTORY:  N/A  PAIN:  Are you having pain? Yes: NPRS scale: 7/10 Pain location: bilat SIJ and into anterior hip Pain description: aching, not sharp Aggravating factors: transfers Relieving factors: heat, massage gun   PRECAUTIONS: None  WEIGHT BEARING RESTRICTIONS No  FALLS:  Has patient fallen in last 6 months? 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    OBJECTIVE:   DIAGNOSTIC FINDINGS:    Lumbar IMPRESSION: 1. Early degenerative changes. 2. No acute bony abnormality.  Pelvis IMPRESSION: Negative.  PATIENT SURVEYS:  FOTO 52 54 @ DC  SCREENING FOR RED FLAGS: Bowel or bladder incontinence: No Spinal tumors: No Cauda equina syndrome: No Compression fracture: No Abdominal aneurysm: No  COGNITION:  Overall cognitive status: Within functional limits for tasks assessed     SENSATION: WFL    POSTURE: No Significant postural limitations  PALPATION: TTP of sacral borders, L2-5 paraspinals, bilat QL  LUMBAR ROM:   Active  A/PROM  eval  Flexion 50% p!  Extension 20% p!   Right lateral flexion 75%  Left lateral flexion 75%  Right rotation 75% p!  Left rotation 75% p!   (Blank rows = not  tested)  LOWER EXTREMITY MMT:     MMT Right eval Left eval  Hip flexion 4+/5 4+/5  Hip extension    Hip abduction 4+/5 4+/5  Hip adduction 4+/5 4+/5   (Blank rows = not tested)  LOWER EXTREMITY ROM:    Passive Right eval Left eval  Hip flexion 100 110  Hip internal rotation 30 30  Hip external rotation 30 50   (Blank rows = not tested)  LUMBAR SPECIAL TESTS:  Straight leg raise test: Negative, Slump test: Negative, SI Compression/distraction test: Negative, FABER test: Negative, and Sacral Thrust negative  FUNCTIONAL TESTS:  DL squat: pain once past parallel, lumbar reversing at end range  GAIT: Distance walked: 48ft Assistive device utilized: None Level of assistance: Complete Independence Comments: increase hip hike, decrease hip rotation  TODAY'S TREATMENT:  Manual: bilat L2-5 UPA and CPA grade III  Exercises - Child's Pose Stretch  - 2 x daily - 7 x weekly - 1 sets - 10 reps - 5 hold - Supine Bridge  - 2 x daily - 7 x weekly - 2 sets - 10 reps - Supine Posterior Pelvic Tilt  - 2 x daily - 7 x weekly - 2 sets - 10 reps - 2 hold - Supine Piriformis Stretch with Foot on Ground  - 2 x daily - 7 x weekly - 1 sets - 3 reps - 30 hold    PATIENT EDUCATION:  Education details: MOI, diagnosis, prognosis, anatomy, exercise progression, DOMS expectations, muscle firing,  envelope of function, HEP, POC   Person educated: Patient Education method: Explanation, Demonstration, Tactile cues, Verbal cues, and Handouts Education comprehension: verbalized understanding, returned demonstration, verbal cues required, and tactile cues required     HOME EXERCISE PROGRAM:  Access Code: [email or web address] URL: [email or web address] Date: 09/19/2021 Prepared by: Daleen Bo      ASSESSMENT:   CLINICAL IMPRESSION: Patient is a 41 y.o. male who was seen today for physical therapy evaluation and treatment for cc of LBP. Pt's s/s appear consistent with mechanical LBP due  to overuse related loading while at the gym.  Pt's largest deficits are  joint stiffness and lumbar muscle spasm. Pt's pain is moderately sensitive and irritable with movement. Pt would benefit from continued skilled therapy in order to reach goals and maximize functional lumbopelvic strength and ROM for full return to occupation, exercise, and recreation.    OBJECTIVE IMPAIRMENTS Abnormal gait, decreased activity tolerance, decreased balance, decreased mobility, difficulty walking, decreased ROM, decreased strength, hypomobility, increased muscle spasms, impaired flexibility, improper body mechanics, postural dysfunction, and pain.    ACTIVITY LIMITATIONS cleaning, community activity, occupation, yard work, shopping, school, and Interior and spatial designer .    PERSONAL FACTORS Fitness, Past/current experiences, and Time since onset of injury/illness/exacerbation are also affecting patient's functional outcome.      REHAB POTENTIAL: Good   CLINICAL DECISION MAKING: Stable/uncomplicated   EVALUATION COMPLEXITY: Low   GOALS:     SHORT TERM GOALS: Target date: 10/31/2021    Pt will become independent with HEP in order to demonstrate synthesis of PT education.    Goal status: INITIAL   2.  Pt will be able to demonstrate ability to squat/lift without pain in order to demonstrate functional improvement in lumbar function for self-care and house hold duties.    Goal status: INITIAL   3.  Pt will score at least 2 pt increase on FOTO to demonstrate functional improvement in MCII and pt perceived function.     Goal status: INITIAL   4.Pt will report at least 2 pt reduction on NPRS scale for pain in order to demonstrate functional improvement with household activity, self care, and ADL.    Goal status: INITIAL     LONG TERM GOALS: Target date: 12/12/2021    Pt  will become independent with final HEP in order to demonstrate synthesis of PT education.  Goal status: INITIAL   2.  Pt will be able  to demonstrate/report ability to walk >30 mins without pain in order to demonstrate functional improvement and tolerance to exercise and community mobility.    Goal status: INITIAL   3.  Pt will be able to demonstrate/report ability to sit/stand/sleep for extended periods of time without pain in order to demonstrate functional improvement and tolerance to static positioning.    Goal status: INITIAL   4.  Pt will score >/= 56 on FOTO to demonstrate improvement in perceived lumbar function.  Goal status: INITIAL     PLAN: PT FREQUENCY: 1-2x/week   PT DURATION: 12 weeks (likely D/C by 8wks)   PLANNED INTERVENTIONS: Therapeutic exercises, Therapeutic activity, Neuromuscular re-education, Balance training, Gait training, Patient/Family education, Joint manipulation, Joint mobilization, Stair training, Aquatic Therapy, Dry Needling, Electrical stimulation, Spinal manipulation, Spinal mobilization, Cryotherapy, Moist heat, Taping, Vasopneumatic device, Traction, Ultrasound, Ionotophoresis 4mg /ml Dexamethasone, and Manual therapy   PLAN FOR NEXT SESSION: TPDN, joint mobs, continue with lumbar stretching, reintro to lumbopelvic strengthening exercise   , PT 09/19/2021, 7:35 PM

## 2021-09-28 NOTE — Progress Notes (Signed)
  [facility] [address] [place] [address] Phone: [phone] Subjective:   [name], am serving as a scribe for Dr. Antoine Primas.  I'm seeing this patient by the request  of:  Sheliah Hatch, MD  CC:   [id number]  Derek Luna is a 41 y.o. male coming in with complaint of back and neck pain. OMT on 08/10/2021. Been to PT 1 time . Patient states felt the same after last manipulation. Here for f/u. Took medication for 2 weeks with no change. Nothing has gotten better.  Medications patient has been prescribed: Zanaflex  Taking:  Lumbar xray showed mild DDD otherwise normal     ? Need for ANA   Reviewed prior external information including notes and imaging from previsou exam, outside providers and external EMR if available.   As well as notes that were available from care everywhere and other healthcare systems.  Past medical history, social, surgical and family history all reviewed in electronic medical record.  No pertanent information unless stated regarding to the chief complaint.   Past Medical History:  Diagnosis Date   Allergic rhinitis 07/04/2012    No Known Allergies   Review of Systems:  No headache, visual changes, nausea, vomiting, diarrhea, constipation, dizziness, abdominal pain, skin rash, fevers, chills, night sweats, weight loss, swollen lymph nodes, body aches, joint swelling, chest pain, shortness of breath, mood changes. POSITIVE muscle aches  Objective  Blood pressure 116/84, pulse 79, height 5\' 8"  (1.727 m), weight 175 lb (79.4 kg), SpO2 98 %.   General: No apparent distress alert and oriented x3 mood and affect normal, dressed appropriately.  HEENT: Pupils equal, extraocular movements intact  Respiratory: Patient's speak in full sentences and does not appear short of breath  Cardiovascular: No lower extremity edema, non tender, no erythema  Gait MSK:  Back low back does have loss of lordosis.   Patient is uncomfortable going from a seated to standing position.  Worsening pain with any extension.  Some possible radicular symptoms down the left leg at 25 degrees of forward flexion.        Assessment and Plan:  Low back pain Patient's low back pain is out of proportion at this time.  Discussed with patient at great length.  Has done all conservative therapy including multiple anti-inflammatories, muscle relaxers, formal physical therapy, home exercises and osteopathic manipulation.  X-rays did show some degenerative disc disease the patient still has pain that is out of proportion we will get laboratory work-up to make sure that there is no other autoimmune disease that could be contributing to this.  We will get MRI as well with patient already failed all conservative therapy and see if there is a nerve impingement or facet arthropathy that could tolerate a possible injection.  We will follow-up after imaging to discuss further       The above documentation has been reviewed and is accurate and complete , DO          Note: This dictation was prepared with Dragon dictation along with smaller phrase technology. Any transcriptional errors that result from this process are unintentional.

## 2021-09-29 ENCOUNTER — Encounter: Payer: Self-pay | Admitting: Family Medicine

## 2021-09-29 ENCOUNTER — Ambulatory Visit: Payer: Commercial Managed Care - HMO | Admitting: Family Medicine

## 2021-09-29 VITALS — BP 116/84 | HR 79 | Ht 68.0 in | Wt 175.0 lb

## 2021-09-29 DIAGNOSIS — M2550 Pain in unspecified joint: Secondary | ICD-10-CM

## 2021-09-29 DIAGNOSIS — M545 Low back pain, unspecified: Secondary | ICD-10-CM | POA: Diagnosis not present

## 2021-09-29 DIAGNOSIS — M549 Dorsalgia, unspecified: Secondary | ICD-10-CM | POA: Diagnosis not present

## 2021-09-29 LAB — VITAMIN D 25 HYDROXY (VIT D DEFICIENCY, FRACTURES): VITD: 36.36 ng/mL (ref 30.00–100.00)

## 2021-09-29 LAB — FERRITIN: Ferritin: 312 ng/mL (ref 22.0–322.0)

## 2021-09-29 LAB — C-REACTIVE PROTEIN: CRP: <1 mg/dL (ref 0.5–20.0)

## 2021-09-29 LAB — URIC ACID: Uric Acid, Serum: 4 mg/dL (ref 4.0–7.8)

## 2021-09-29 LAB — SEDIMENTATION RATE: Sed Rate: 15 mm/hr (ref 0–15)

## 2021-09-29 NOTE — Assessment & Plan Note (Signed)
Patient's low back pain is out of proportion at this time.  Discussed with patient at great length.  Has done all conservative therapy including multiple anti-inflammatories, muscle relaxers, formal physical therapy, home exercises and osteopathic manipulation.  X-rays did show some degenerative disc disease the patient still has pain that is out of proportion we will get laboratory work-up to make sure that there is no other autoimmune disease that could be contributing to this.  We will get MRI as well with patient already failed all conservative therapy and see if there is a nerve impingement or facet arthropathy that could tolerate a possible injection.  We will follow-up after imaging to discuss further

## 2021-09-29 NOTE — Patient Instructions (Addendum)
[facility] [phone] ?Call Today ? ?When we receive your results we will contact you. ?Labs today ? ?

## 2021-10-02 LAB — ANCA TITERS
Atypical pANCA: <1:20 {titer}
C-ANCA: <1:20 {titer}
P-ANCA: <1:20 {titer}

## 2021-10-02 LAB — ANGIOTENSIN CONVERTING ENZYME: Angiotensin-Converting Enzyme: 15 U/L (ref 9–67)

## 2021-10-02 LAB — CYCLIC CITRUL PEPTIDE ANTIBODY, IGG: Cyclic Citrullin Peptide Ab: <16 UNITS

## 2021-10-02 LAB — HLA-B27 ANTIGEN: HLA-B27 Antigen: NEGATIVE

## 2021-10-02 LAB — ANA: Anti Nuclear Antibody (ANA): NEGATIVE

## 2021-10-03 ENCOUNTER — Other Ambulatory Visit: Payer: Commercial Managed Care - HMO

## 2021-10-05 ENCOUNTER — Ambulatory Visit
Admission: RE | Admit: 2021-10-05 | Discharge: 2021-10-05 | Disposition: A | Discharge status: Home / Self Care | Payer: Commercial Managed Care - HMO | Source: Ambulatory Visit | Attending: Family Medicine | Admitting: Family Medicine

## 2021-10-05 DIAGNOSIS — M549 Dorsalgia, unspecified: Secondary | ICD-10-CM

## 2021-10-26 IMAGING — CT CT RENAL STONE PROTOCOL
2 of 4 series · 14 of 46 positions shown, 16 images · non-contrast
Comparison: None.

CLINICAL DATA: Left flank pain

EXAM:
CT ABDOMEN AND PELVIS WITHOUT CONTRAST
TECHNIQUE: Multidetector CT imaging of the abdomen and pelvis was performed
following the standard protocol without IV contrast.

[Series 2: renal stone 5.00 br40 s3 axial · axial · 0.66mm/px · z∈[+1114,+1519]mm · 11 of 97 slices shown, 13 images]
[im 8/97  soft-tissue]
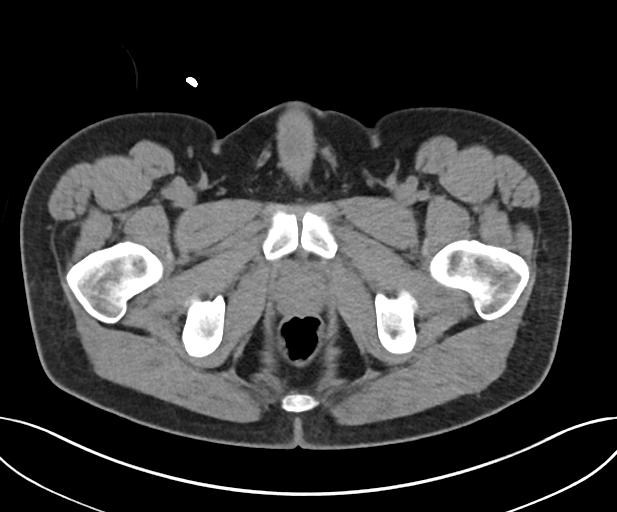
[im 8/97  bone]
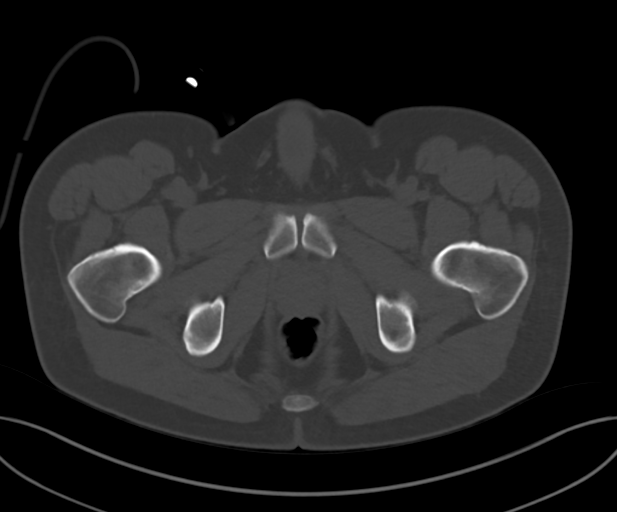
[im 15/97  soft-tissue]
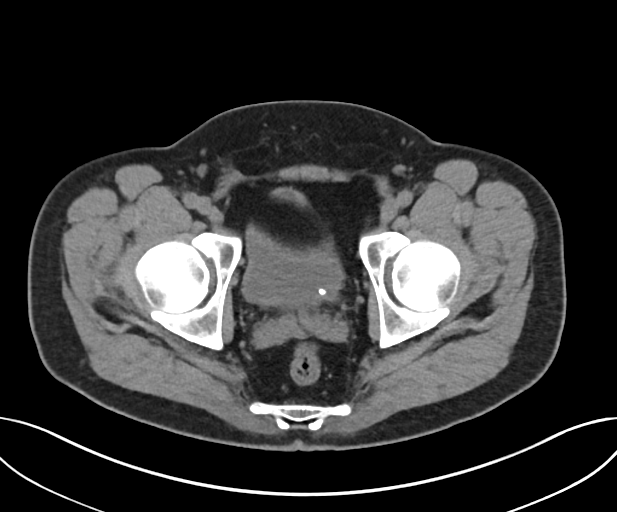
[im 23/97  soft-tissue]
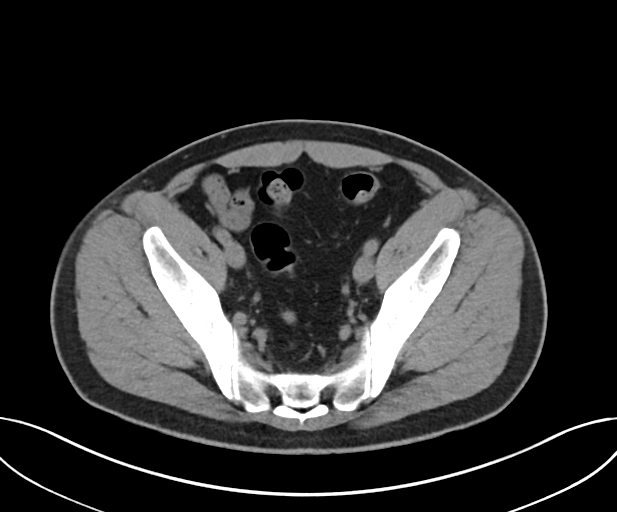
[im 30/97  soft-tissue]
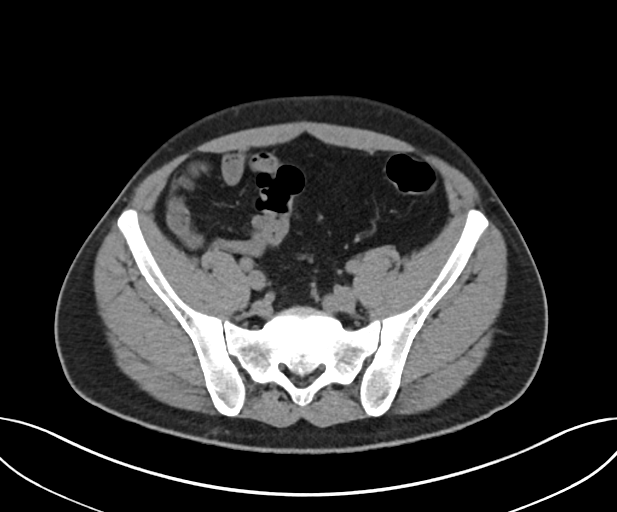
[im 41/97  soft-tissue]
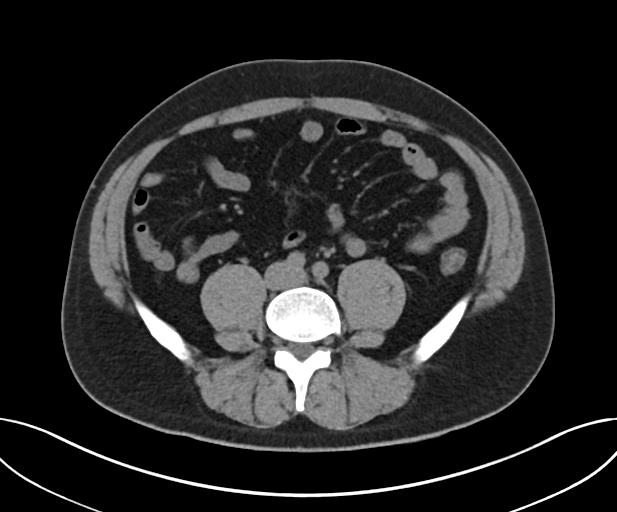
[im 49/97  soft-tissue]
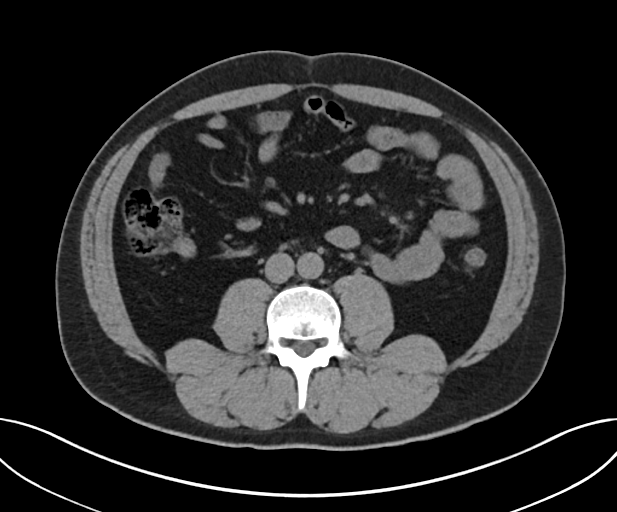
[im 56/97  soft-tissue]
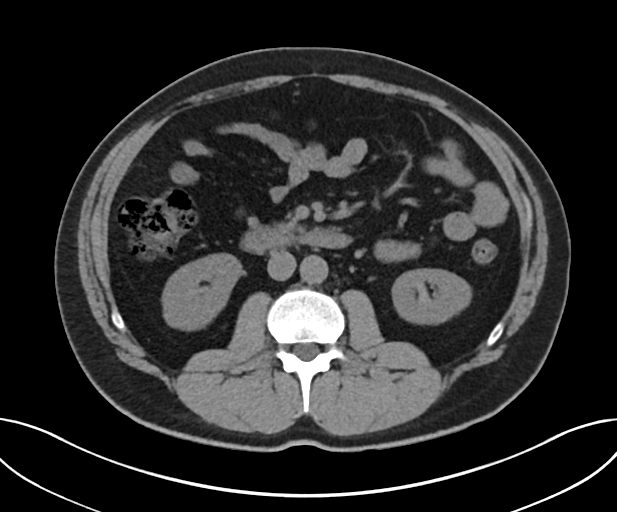
[im 67/97  soft-tissue]
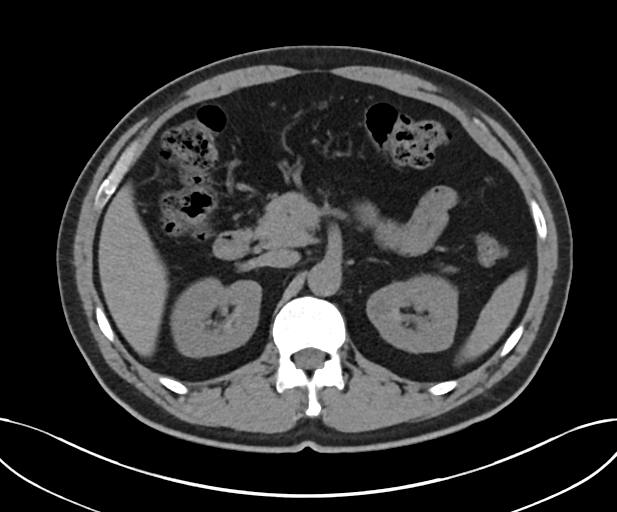
[im 74/97  soft-tissue]
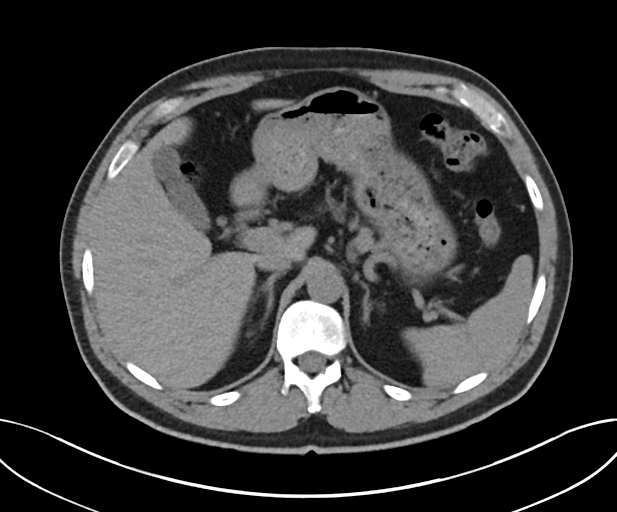
[im 74/97  bone]
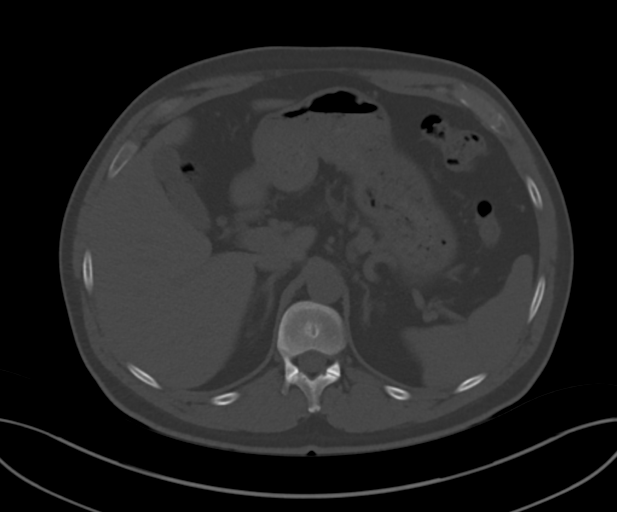
[im 82/97  soft-tissue]
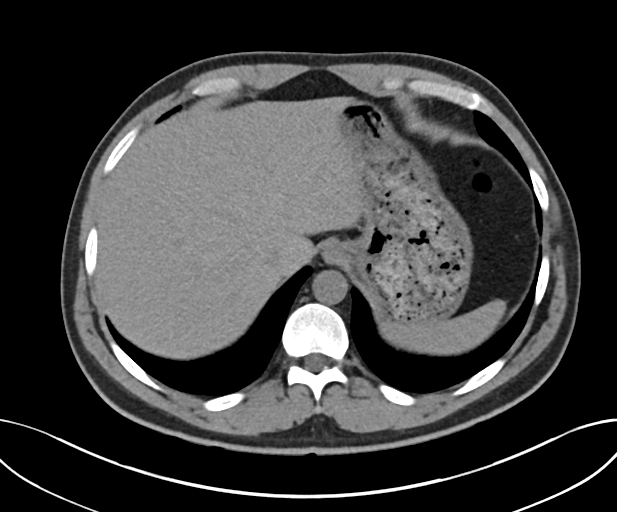
[im 89/97  soft-tissue]
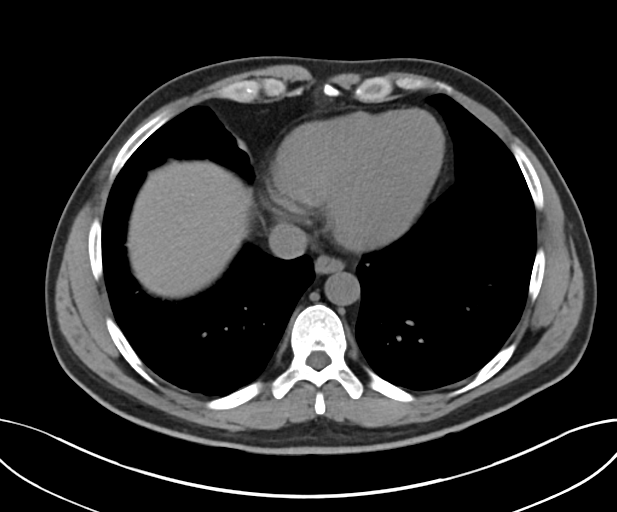

[Series 6: renal stone 2.00 br40 s3 cor · coronal · 0.80mm/px · 3 of 167 slices shown]
[im 56/167  soft-tissue]
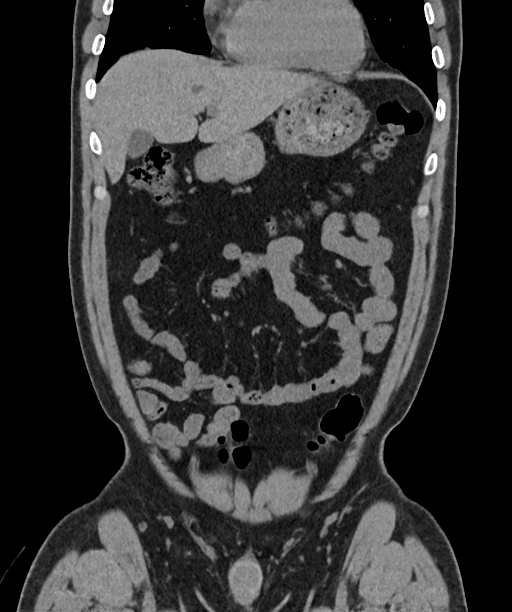
[im 74/167  soft-tissue]
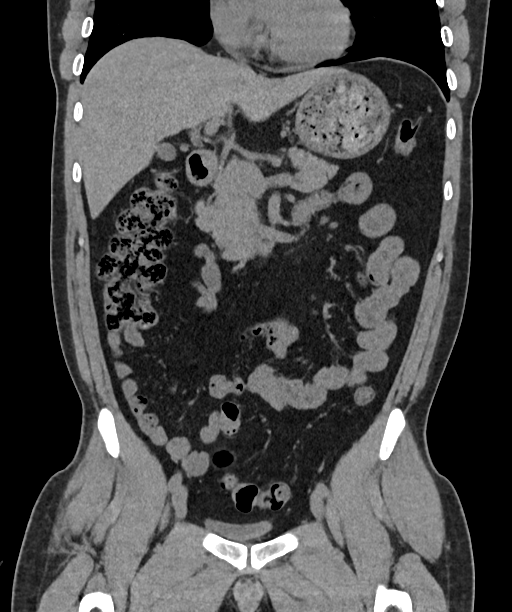
[im 93/167  soft-tissue]
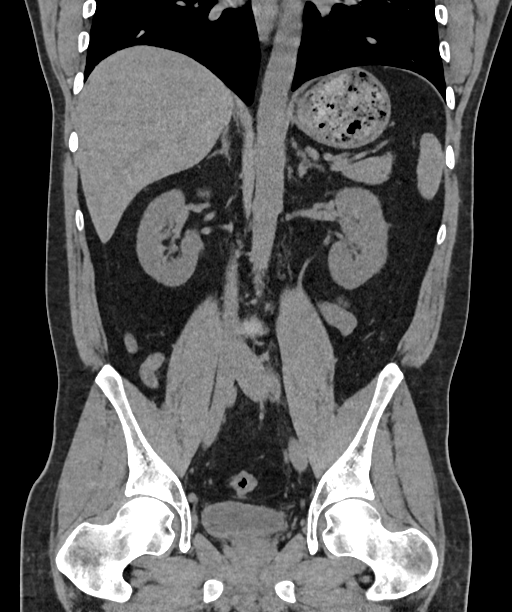

[14 of 46 positions shown; findings below may reference images not displayed]

FINDINGS: Lower chest: The lung bases are clear. The heart size is normal.

Hepatobiliary: The liver is normal. Normal gallbladder.There is no
biliary ductal dilation.

Pancreas: Normal contours without ductal dilatation. No
peripancreatic fluid collection.

Spleen: Unremarkable.

Adrenals/Urinary Tract:

--Adrenal glands: Unremarkable.

--Right kidney/ureter: No hydronephrosis or radiopaque kidney
stones.

--Left kidney/ureter: There is a nonobstructing 4 mm stone at the
left UVJ (axial series 2, image 83). There is no left-sided
hydronephrosis.

--Urinary bladder: Unremarkable.

Stomach/Bowel:

--Stomach/Duodenum: No hiatal hernia or other gastric abnormality.
Normal duodenal course and caliber.

--Small bowel: Unremarkable.

--Colon: Unremarkable.

--Appendix: Normal.

Vascular/Lymphatic: Normal course and caliber of the major abdominal
vessels.

--No retroperitoneal lymphadenopathy.

--No mesenteric lymphadenopathy.

--No pelvic or inguinal lymphadenopathy.

Reproductive: Unremarkable

Other: No ascites or free air. The abdominal wall is normal.

Musculoskeletal. No acute displaced fractures.
IMPRESSION: Nonobstructing 4 mm stone at the left UVJ. No left-sided
hydronephrosis.

## 2022-03-09 ENCOUNTER — Encounter: Payer: Managed Care, Other (non HMO) | Admitting: Family Medicine
